# Patient Record
Sex: Male | Born: 1975
Health system: Southern US, Community
[De-identification: ages and names within clinical notes are randomized; demographics above are authoritative.]

## PROBLEM LIST (undated history)

## (undated) DIAGNOSIS — IMO0001 Reserved for inherently not codable concepts without codable children: Secondary | ICD-10-CM

## (undated) DIAGNOSIS — N200 Calculus of kidney: Secondary | ICD-10-CM

## (undated) DIAGNOSIS — K219 Gastro-esophageal reflux disease without esophagitis: Secondary | ICD-10-CM

## (undated) DIAGNOSIS — F32A Depression, unspecified: Secondary | ICD-10-CM

## (undated) DIAGNOSIS — F419 Anxiety disorder, unspecified: Secondary | ICD-10-CM

## (undated) DIAGNOSIS — F329 Major depressive disorder, single episode, unspecified: Secondary | ICD-10-CM

## (undated) DIAGNOSIS — R7989 Other specified abnormal findings of blood chemistry: Secondary | ICD-10-CM

## (undated) DIAGNOSIS — I10 Essential (primary) hypertension: Secondary | ICD-10-CM

## (undated) DIAGNOSIS — G4730 Sleep apnea, unspecified: Secondary | ICD-10-CM

## (undated) HISTORY — DX: Reserved for inherently not codable concepts without codable children: IMO0001

## (undated) HISTORY — DX: Depression, unspecified: F32.A

## (undated) HISTORY — DX: Calculus of kidney: N20.0

## (undated) HISTORY — DX: Sleep apnea, unspecified: G47.30

## (undated) HISTORY — DX: Major depressive disorder, single episode, unspecified: F32.9

## (undated) HISTORY — DX: Other specified abnormal findings of blood chemistry: R79.89

## (undated) HISTORY — DX: Gastro-esophageal reflux disease without esophagitis: K21.9

---

## 2000-03-21 HISTORY — PX: VASECTOMY: SHX75

## 2002-07-30 ENCOUNTER — Emergency Department (HOSPITAL_COMMUNITY): Admission: EM | Admit: 2002-07-30 | Discharge: 2002-07-31 | Payer: Self-pay | Admitting: Emergency Medicine

## 2002-07-31 ENCOUNTER — Encounter: Payer: Self-pay | Admitting: Emergency Medicine

## 2003-01-06 ENCOUNTER — Emergency Department (HOSPITAL_COMMUNITY): Admission: EM | Admit: 2003-01-06 | Discharge: 2003-01-07 | Payer: Self-pay | Admitting: Emergency Medicine

## 2003-09-12 ENCOUNTER — Ambulatory Visit (HOSPITAL_COMMUNITY): Admission: RE | Admit: 2003-09-12 | Discharge: 2003-09-12 | Payer: Self-pay | Admitting: Family Medicine

## 2004-01-20 ENCOUNTER — Ambulatory Visit (HOSPITAL_COMMUNITY): Admission: RE | Admit: 2004-01-20 | Discharge: 2004-01-20 | Payer: Self-pay | Admitting: Family Medicine

## 2005-01-07 ENCOUNTER — Emergency Department (HOSPITAL_COMMUNITY): Admission: EM | Admit: 2005-01-07 | Discharge: 2005-01-07 | Payer: Self-pay | Admitting: Emergency Medicine

## 2006-01-31 ENCOUNTER — Ambulatory Visit: Admission: RE | Admit: 2006-01-31 | Discharge: 2006-01-31 | Payer: Self-pay | Admitting: Family Medicine

## 2006-02-11 ENCOUNTER — Ambulatory Visit: Payer: Self-pay | Admitting: Pulmonary Disease

## 2006-08-18 ENCOUNTER — Ambulatory Visit (HOSPITAL_COMMUNITY): Admission: RE | Admit: 2006-08-18 | Discharge: 2006-08-18 | Payer: Self-pay | Admitting: Family Medicine

## 2007-05-27 ENCOUNTER — Emergency Department (HOSPITAL_COMMUNITY): Admission: EM | Admit: 2007-05-27 | Discharge: 2007-05-27 | Payer: Self-pay | Admitting: Emergency Medicine

## 2008-11-04 ENCOUNTER — Emergency Department (HOSPITAL_COMMUNITY): Admission: EM | Admit: 2008-11-04 | Discharge: 2008-11-04 | Payer: Self-pay | Admitting: Emergency Medicine

## 2009-06-14 ENCOUNTER — Emergency Department (HOSPITAL_COMMUNITY): Admission: EM | Admit: 2009-06-14 | Discharge: 2009-06-14 | Payer: Self-pay | Admitting: Emergency Medicine

## 2010-06-13 LAB — DIFFERENTIAL
Eosinophils Absolute: 0.1 10*3/uL (ref 0.0–0.7)
Eosinophils Relative: 1 % (ref 0–5)
Monocytes Absolute: 0.5 10*3/uL (ref 0.1–1.0)
Monocytes Relative: 7 % (ref 3–12)
Neutro Abs: 3.7 10*3/uL (ref 1.7–7.7)

## 2010-06-13 LAB — URINALYSIS, ROUTINE W REFLEX MICROSCOPIC
Ketones, ur: NEGATIVE mg/dL
Nitrite: NEGATIVE
Protein, ur: NEGATIVE mg/dL
Urobilinogen, UA: 0.2 mg/dL (ref 0.0–1.0)

## 2010-06-13 LAB — CBC
HCT: 41.9 % (ref 39.0–52.0)
MCV: 86.5 fL (ref 78.0–100.0)
Platelets: 227 10*3/uL (ref 150–400)
WBC: 7.4 10*3/uL (ref 4.0–10.5)

## 2010-06-13 LAB — URINE MICROSCOPIC-ADD ON

## 2010-06-13 LAB — BASIC METABOLIC PANEL
BUN: 16 mg/dL (ref 6–23)
CO2: 25 mEq/L (ref 19–32)
Chloride: 105 mEq/L (ref 96–112)
Creatinine, Ser: 1.22 mg/dL (ref 0.4–1.5)
GFR calc Af Amer: >60 mL/min (ref >60)
Glucose, Bld: 115 mg/dL — ABNORMAL HIGH (ref 70–99)
Potassium: 4 mEq/L (ref 3.5–5.1)
Sodium: 138 mEq/L (ref 135–145)

## 2010-06-26 LAB — URINALYSIS, ROUTINE W REFLEX MICROSCOPIC
Glucose, UA: NEGATIVE mg/dL
Leukocytes, UA: NEGATIVE
Nitrite: NEGATIVE
Protein, ur: NEGATIVE mg/dL
Urobilinogen, UA: 0.2 mg/dL (ref 0.0–1.0)

## 2010-06-26 LAB — URINE MICROSCOPIC-ADD ON

## 2010-08-06 NOTE — Procedures (Signed)
NAME:  Jose Adkins, Jose Adkins NO.:  [id number]   MEDICAL RECORD NO.:  [id number]          PATIENT TYPE:  OUT   LOCATION:  SLEEP LAB                     FACILITY:  [facility]   PHYSICIAN:  Barbaraann Share, MD,FCCPDATE OF BIRTH:  October 29, 1974   DATE OF STUDY:  01/31/2006                              NOCTURNAL POLYSOMNOGRAM   REFERRING PHYSICIAN:  Dr. Lorin Picket Lucking   INDICATION FOR THE STUDY:  Hypersomnia with sleep apnea.   EPWORTH SCORE:  17.   SLEEP ARCHITECTURE:  The patient had a total sleep time of 405 minutes with  decreased slow wave sleep as well as REM.  Sleep onset latency was normal at  17 minutes and REM onset was prolonged at 219 minutes.  Sleep efficiency was  mildly decreased at 91%.   RESPIRATORY DATA:  The patient underwent split night protocol where he was  found to have 250 obstructive events in the first 122 minutes of sleep.  This gave the patient a Respiratory Disturbance Index of 123 events per  hour.  Events were not positional but there was extremely loud snoring noted  throughout.  By protocol, the patient was then placed on a small Respironics  Comfort Select nasal mask and CPAP titration was initiated.  At a final  pressure of 12 cm the patient appeared to have reasonable control of his  obstructive events.  However, he only spent 10 minutes of sleep at this  final setting.  There was not enough time due to the nature of split night  studies for more prolonged assessment in pressure titration.   OXYGEN DATA:  His O2 desaturation was low at 71% with the patient's  obstructive events.   CARDIAC DATA:  No clinically significant cardiac arrhythmias.   MOVEMENT/PARASOMNIA:  Rare leg jerks were noted without clinical  significance.   IMPRESSION/RECOMMENDATION:  Very severe obstructive sleep apnea/hypopnea  syndrome with a Respiratory Disturbance Index of 123 events per hour and O2  desaturation as low as 71% during the first half of the night.  The  patient  was then placed on a small Respironics Comfort Select nasal mask and  ultimately titrated to 12 cm with what appears to be fairly good control.  However, the patient spent a very short amount of time on this pressure  level and this may not be adequate pressure.  Clinical correlation is  suggested after getting the patient to an  optimal CPAP of 12 cm and seeing how he responds.  If the patient does not  have significant clinical improvement, I would suggest that he return for  formal CPAP titration given the very severe nature of his sleep apnea.      Barbaraann Share, MD,FCCP  Diplomate, American Board of Sleep  Medicine     [facility]/MEDQ  D:  02/10/2006 16:13:22  T:  02/10/2006 17:46:15  Job:  [id number]

## 2010-12-13 LAB — CBC
HCT: 38.5 — ABNORMAL LOW
MCHC: 34.9
MCV: 86.2
Platelets: 187

## 2010-12-13 LAB — BASIC METABOLIC PANEL
Creatinine, Ser: 1.1
GFR calc Af Amer: >60
GFR calc non Af Amer: >60
Glucose, Bld: 84
Sodium: 139

## 2010-12-13 LAB — DIFFERENTIAL
Basophils Relative: 1
Eosinophils Absolute: 0.1
Lymphocytes Relative: 37
Lymphs Abs: 2.3
Monocytes Absolute: 0.6
Monocytes Relative: 9
Neutro Abs: 3.2
Neutrophils Relative %: 51

## 2010-12-13 LAB — POCT CARDIAC MARKERS
CKMB, poc: <1 — ABNORMAL LOW
Myoglobin, poc: 66.9
Troponin i, poc: <0.05
Troponin i, poc: <0.05

## 2011-06-18 ENCOUNTER — Encounter (HOSPITAL_COMMUNITY): Payer: Self-pay | Admitting: *Deleted

## 2011-06-18 ENCOUNTER — Emergency Department (HOSPITAL_COMMUNITY): Payer: Self-pay

## 2011-06-18 ENCOUNTER — Emergency Department (HOSPITAL_COMMUNITY)
Admission: EM | Admit: 2011-06-18 | Discharge: 2011-06-18 | Disposition: A | Discharge status: Home / Self Care | Payer: Self-pay | Attending: Emergency Medicine | Admitting: Emergency Medicine

## 2011-06-18 DIAGNOSIS — R109 Unspecified abdominal pain: Secondary | ICD-10-CM | POA: Insufficient documentation

## 2011-06-18 HISTORY — DX: Calculus of kidney: N20.0

## 2011-06-18 LAB — CBC
Hemoglobin: 14 g/dL (ref 13.0–17.0)
Platelets: 217 10*3/uL (ref 150–400)
RBC: 4.88 MIL/uL (ref 4.22–5.81)
RDW: 14.9 % (ref 11.5–15.5)

## 2011-06-18 LAB — URINALYSIS, ROUTINE W REFLEX MICROSCOPIC
Nitrite: NEGATIVE
Protein, ur: NEGATIVE mg/dL
Specific Gravity, Urine: 1.025 (ref 1.005–1.030)
Urobilinogen, UA: 0.2 mg/dL (ref 0.0–1.0)

## 2011-06-18 LAB — COMPREHENSIVE METABOLIC PANEL
ALT: 42 U/L (ref 0–53)
Albumin: 4.1 g/dL (ref 3.5–5.2)
CO2: 25 mEq/L (ref 19–32)
Calcium: 8.9 mg/dL (ref 8.4–10.5)
Chloride: 105 mEq/L (ref 96–112)
Creatinine, Ser: 1.13 mg/dL (ref 0.50–1.35)
GFR calc non Af Amer: 82 mL/min — ABNORMAL LOW (ref >90)
Glucose, Bld: 104 mg/dL — ABNORMAL HIGH (ref 70–99)
Potassium: 3.6 mEq/L (ref 3.5–5.1)
Total Protein: 7.1 g/dL (ref 6.0–8.3)

## 2011-06-18 LAB — DIFFERENTIAL
Basophils Absolute: 0.1 10*3/uL (ref 0.0–0.1)
Monocytes Absolute: 0.5 10*3/uL (ref 0.1–1.0)
Monocytes Relative: 7 % (ref 3–12)
Neutro Abs: 3.4 10*3/uL (ref 1.7–7.7)

## 2011-06-18 MED ORDER — MORPHINE SULFATE 4 MG/ML IJ SOLN
8.0000 mg | Freq: Once | INTRAMUSCULAR | Status: AC
  Administered 2011-06-18: 8 mg via INTRAVENOUS
  Filled 2011-06-18: qty 2

## 2011-06-18 MED ORDER — IOHEXOL 300 MG/ML  SOLN
100.0000 mL | Freq: Once | INTRAMUSCULAR | Status: AC | PRN
  Administered 2011-06-18: 100 mL via INTRAVENOUS

## 2011-06-18 MED ORDER — ONDANSETRON HCL 4 MG/2ML IJ SOLN
4.0000 mg | Freq: Once | INTRAMUSCULAR | Status: AC
  Administered 2011-06-18: 4 mg via INTRAVENOUS
  Filled 2011-06-18: qty 2

## 2011-06-18 MED ORDER — OXYCODONE-ACETAMINOPHEN 5-325 MG PO TABS: 1.0000 | ORAL_TABLET | ORAL | Status: AC | PRN

## 2011-06-18 MED ORDER — HYDROMORPHONE HCL PF 1 MG/ML IJ SOLN
1.0000 mg | Freq: Once | INTRAMUSCULAR | Status: AC
  Administered 2011-06-18: 1 mg via INTRAVENOUS
  Filled 2011-06-18: qty 1

## 2011-06-18 NOTE — ED Provider Notes (Signed)
History     CSN: [id number]  Arrival date & time 06/18/11  0207   First MD Initiated Contact with Patient 06/18/11 [phone]      Chief Complaint  Patient presents with  . Abdominal Pain    The history is provided by the patient.   the patient developed severe sharp left-sided abdominal pain that began tonight.  He had an episode of diarrhea and approximately an hour and half later developed spasms of his left-sided abdominal pain.  There is no radiation down to his groin or to his left flank.  He denies dysuria or urinary frequency.  He does report that recently he was started on pain medicine for suspected left-sided ureteral stone his PCP without imaging that was obtained.  The patient does have a history of ureteral stones in the past.  He denies nausea or vomiting.  He reports one episode of diarrhea that was loose without blood.  He denies melena.  Nothing worsens the symptoms.  Nothing improves his symptoms.  Symptoms are intermittent and moderate in severity.  His had no fever or chills.  He denies back pain.  He has no chest pain or shortness of breath.  He has no weakness of his upper lower extremities.  Past Medical History  Diagnosis Date  . Kidney stone     History reviewed. No pertinent past surgical history.  No family history on file.  History  Substance Use Topics  . Smoking status: Never Smoker   . Smokeless tobacco: Not on file  . Alcohol Use: No      Review of Systems  All other systems reviewed and are negative.    Allergies  Review of patient's allergies indicates not on file.  Home Medications   Current Outpatient Rx  Name Route Sig Dispense Refill  . AMBIEN PO Oral Take by mouth.      BP 129/93  Pulse 91  Temp(Src) 98.7 F (37.1 C) (Oral)  Resp 20  Ht 5\' 8"  (1.727 m)  Wt 342 lb (155.13 kg)  BMI 52.00 kg/m2  SpO2 97%  Physical Exam  Nursing note and vitals reviewed. Constitutional: He is oriented to person, place, and time. He appears  well-developed and well-nourished.       Uncomfortable appearing  HENT:  Head: Normocephalic and atraumatic.  Eyes: EOM are normal.  Neck: Normal range of motion.  Cardiovascular: Normal rate, regular rhythm, normal heart sounds and intact distal pulses.   Pulmonary/Chest: Effort normal and breath sounds normal. No respiratory distress.  Abdominal: Soft. He exhibits no distension. There is tenderness.       Sided abdominal tenderness without guarding or rebound  Musculoskeletal: Normal range of motion.  Neurological: He is alert and oriented to person, place, and time.  Skin: Skin is warm and dry.  Psychiatric: He has a normal mood and affect. Judgment normal.    ED Course  Procedures (including critical care time)  Labs Reviewed  COMPREHENSIVE METABOLIC PANEL - Abnormal; Notable for the following:    Glucose, Bld 104 (*)    GFR calc non Af Amer 82 (*)    All other components within normal limits  URINALYSIS, ROUTINE W REFLEX MICROSCOPIC  CBC  DIFFERENTIAL  LIPASE, BLOOD   Ct Abdomen Pelvis W Contrast  06/18/2011  *RADIOLOGY REPORT*  Clinical Data: Left upper and lower quadrant abdominal pain. History of kidney stones.  CT ABDOMEN AND PELVIS WITH CONTRAST  Technique:  Multidetector CT imaging of the abdomen and pelvis was performed  following the standard protocol during bolus administration of intravenous contrast.  Contrast: OMNIPAQUE IOHEXOL 300 MG/ML IJ SOLN  Comparison: 06/14/2009  Findings: Slight fibrosis in the lung bases.  Diffuse low attenuation change throughout the liver consistent with fatty infiltration.  No focal lesions demonstrated.  The spleen, gallbladder, pancreas, adrenal glands, abdominal aorta, and retroperitoneal lymph nodes are unremarkable.  Horseshoe tight kidney.  No solid mass or hydronephrosis demonstrated in the kidneys.  The stomach, small bowel, and colon are not distended. No wall thickening or infiltrative changes.  No evidence of bowel  obstruction.  Minimal fat in the emboli this.  No free air or free fluid in the abdomen.  Scattered mesenteric lymph nodes are not pathologically enlarged.  Pelvis:  No free or loculated pelvic fluid collections.  The prostate gland is not enlarged.  No significant pelvic lymphadenopathy.  The bladder wall is not thickened.  No inflammatory changes in the sigmoid colon or right lower quadrant although the appendix is not specifically identified.  Normal alignment of the lumbar vertebra.  No destructive bone lesions appreciated.  IMPRESSION: Diffuse fatty infiltration of the liver.  Horseshoe kidney.  No evidence of renal obstruction.  No inflammatory changes to account for the patient's pain.  Original Report Authenticated By: Marlon Pel, M.D.   I personally reviewed the CT scan  1. Abdominal pain       MDM  Patient tenderness of his left side of his abdomen without obvious cause.  Given his degree of discomfort a CT scan of his abdomen and pelvis was done as the patient does have a history of ureteral stones.  Labs urine and CT scan are normal.  The patient reports he feels much better at this time.  DC home with close PCP followup and a short course of pain medicine.  He understands to return to the ER for new or worsening symptoms al        Lyanne Co, MD 06/18/11 [phone]

## 2011-06-18 NOTE — ED Notes (Signed)
Pt reports he has been followed by PCP for kidney stone pain for the past couple of weeks, tonight he reports very sharp "stabbing" pain pn left side of abd radiating up rt side

## 2011-09-16 ENCOUNTER — Emergency Department (HOSPITAL_COMMUNITY): Payer: Self-pay

## 2011-09-16 ENCOUNTER — Emergency Department (HOSPITAL_COMMUNITY)
Admission: EM | Admit: 2011-09-16 | Discharge: 2011-09-16 | Disposition: A | Discharge status: Home / Self Care | Payer: Self-pay | Attending: Emergency Medicine | Admitting: Emergency Medicine

## 2011-09-16 ENCOUNTER — Encounter (HOSPITAL_COMMUNITY): Payer: Self-pay | Admitting: Emergency Medicine

## 2011-09-16 ENCOUNTER — Other Ambulatory Visit: Payer: Self-pay

## 2011-09-16 DIAGNOSIS — Z79899 Other long term (current) drug therapy: Secondary | ICD-10-CM | POA: Insufficient documentation

## 2011-09-16 DIAGNOSIS — Z87442 Personal history of urinary calculi: Secondary | ICD-10-CM | POA: Insufficient documentation

## 2011-09-16 DIAGNOSIS — R209 Unspecified disturbances of skin sensation: Secondary | ICD-10-CM | POA: Insufficient documentation

## 2011-09-16 DIAGNOSIS — F411 Generalized anxiety disorder: Secondary | ICD-10-CM | POA: Insufficient documentation

## 2011-09-16 DIAGNOSIS — R51 Headache: Secondary | ICD-10-CM | POA: Insufficient documentation

## 2011-09-16 DIAGNOSIS — R42 Dizziness and giddiness: Secondary | ICD-10-CM | POA: Insufficient documentation

## 2011-09-16 DIAGNOSIS — I951 Orthostatic hypotension: Secondary | ICD-10-CM

## 2011-09-16 HISTORY — DX: Anxiety disorder, unspecified: F41.9

## 2011-09-16 LAB — URINALYSIS, ROUTINE W REFLEX MICROSCOPIC
Glucose, UA: NEGATIVE mg/dL
Ketones, ur: NEGATIVE mg/dL
Leukocytes, UA: NEGATIVE
Specific Gravity, Urine: 1.03 (ref 1.005–1.030)
pH: 6 (ref 5.0–8.0)

## 2011-09-16 LAB — CBC WITH DIFFERENTIAL/PLATELET
Basophils Absolute: 0 10*3/uL (ref 0.0–0.1)
Eosinophils Relative: 2 % (ref 0–5)
HCT: 41.4 % (ref 39.0–52.0)
Hemoglobin: 13.3 g/dL (ref 13.0–17.0)
Lymphocytes Relative: 33 % (ref 12–46)
Lymphs Abs: 2 10*3/uL (ref 0.7–4.0)
MCV: 88.5 fL (ref 78.0–100.0)
Monocytes Absolute: 0.4 10*3/uL (ref 0.1–1.0)
Monocytes Relative: 7 % (ref 3–12)
Neutro Abs: 3.4 10*3/uL (ref 1.7–7.7)
RBC: 4.68 MIL/uL (ref 4.22–5.81)
WBC: 6 10*3/uL (ref 4.0–10.5)

## 2011-09-16 LAB — COMPREHENSIVE METABOLIC PANEL
ALT: 30 U/L (ref 0–53)
Albumin: 3.6 g/dL (ref 3.5–5.2)
Alkaline Phosphatase: 75 U/L (ref 39–117)
CO2: 25 mEq/L (ref 19–32)
Chloride: 104 mEq/L (ref 96–112)
Glucose, Bld: 79 mg/dL (ref 70–99)
Total Protein: 6.6 g/dL (ref 6.0–8.3)

## 2011-09-16 LAB — SALICYLATE LEVEL: Salicylate Lvl: <2 mg/dL — ABNORMAL LOW (ref 2.8–20.0)

## 2011-09-16 LAB — ETHANOL: Alcohol, Ethyl (B): <11 mg/dL (ref 0–11)

## 2011-09-16 LAB — CARDIAC PANEL(CRET KIN+CKTOT+MB+TROPI)
CK, MB: 2.7 ng/mL (ref 0.3–4.0)
Relative Index: 1.7 (ref 0.0–2.5)
Troponin I: <0.3 ng/mL (ref <0.30)

## 2011-09-16 LAB — ACETAMINOPHEN LEVEL: Acetaminophen (Tylenol), Serum: <15 ug/mL (ref 10–30)

## 2011-09-16 LAB — RAPID URINE DRUG SCREEN, HOSP PERFORMED
Barbiturates: NOT DETECTED
Cocaine: NOT DETECTED

## 2011-09-16 MED ORDER — SODIUM CHLORIDE 0.9 % IV BOLUS (SEPSIS)
1000.0000 mL | Freq: Once | INTRAVENOUS | Status: AC
  Administered 2011-09-16: 1000 mL via INTRAVENOUS

## 2011-09-16 MED ORDER — LORAZEPAM 2 MG/ML IJ SOLN
1.0000 mg | Freq: Once | INTRAMUSCULAR | Status: AC
  Administered 2011-09-16: 1 mg via INTRAVENOUS
  Filled 2011-09-16: qty 1

## 2011-09-16 MED ORDER — MECLIZINE HCL 12.5 MG PO TABS
25.0000 mg | ORAL_TABLET | Freq: Once | ORAL | Status: AC
  Administered 2011-09-16: 25 mg via ORAL
  Filled 2011-09-16: qty 2

## 2011-09-16 NOTE — ED Notes (Signed)
Pt sleeping. Wife at bedside .

## 2011-09-16 NOTE — ED Notes (Signed)
NSR on cardiac monitor. Pt anxious and states that he just doesn't feel right. Saline lock in place with no edema or redness. Family at bedside.

## 2011-09-16 NOTE — ED Provider Notes (Signed)
History   This chart was scribed for Glynn Octave, MD by Shari Heritage. The patient was seen in room APA04/APA04. Patient's care was started at 0841.     CSN: [id number]  Arrival date & time 09/16/11  0841   First MD Initiated Contact with Patient 09/16/11 [phone]      Chief Complaint  Patient presents with  . Headache  . Dizziness    (Consider location/radiation/quality/duration/timing/severity/associated sxs/prior treatment) The history is provided by the patient. No language interpreter was used.   Jose Adkins is a 36 y.o. male who presents to the Emergency Department complaining of a throbbing, temporal HA with associated dizziness, tingling in fingers and toes, and lightheadedness. Patient says he woke up with a headache this morning. Patient says that his pain feels like a regular headache. Patient says that he feels like the room is spinning when he sits up or stands up. Patient is alert. Patient knows he is at [facility], his date of birth and his wife's name. Patient says that he has a normal appetite. Patient denies any other pain.Patient was transported to the ED by EMS after he began feeling dizzy at work. Patient with h/o kidney stones and anxiety. Patient takes Cymbalta for anxiety. Patient denies h/o diabetes. Patient has never smoked.  PCP - Lilyan Punt  Past Medical History  Diagnosis Date  . Kidney stone   . Anxiety     History reviewed. No pertinent past surgical history.  History reviewed. No pertinent family history.  History  Substance Use Topics  . Smoking status: Never Smoker   . Smokeless tobacco: Not on file  . Alcohol Use: No      Review of Systems A complete 10 system review of systems was obtained and all systems are negative except as noted in the HPI and PMH.   Allergies  Review of patient's allergies indicates no known allergies.  Home Medications   Current Outpatient Rx  Name Route Sig Dispense Refill  . DULOXETINE HCL 30 MG PO  CPEP Oral Take 30 mg by mouth daily.    Marland Kitchen TEMAZEPAM 7.5 MG PO CAPS Oral Take 7.5 mg by mouth at bedtime as needed. For sleep    . TESTOSTERONE 50 MG/5GM TD GEL Transdermal Place 5 g onto the skin daily. 4 pumps      BP 123/92  Pulse 82  Temp 99.2 F (37.3 C) (Oral)  Resp 20  Ht 5\' 8"  (1.727 m)  Wt 340 lb (154.223 kg)  BMI 51.70 kg/m2  SpO2 98%  Physical Exam  Nursing note and vitals reviewed. Constitutional: He is oriented to person, place, and time. He appears well-developed and well-nourished.  HENT:  Head: Normocephalic and atraumatic.  Eyes: Conjunctivae and EOM are normal. Pupils are equal, round, and reactive to light. Right eye exhibits no nystagmus. Left eye exhibits no nystagmus.  Neck: Normal range of motion. Neck supple.  Cardiovascular: Normal rate, regular rhythm and normal heart sounds.   Pulmonary/Chest: Effort normal and breath sounds normal. No respiratory distress. He has no wheezes.  Abdominal: Soft. Bowel sounds are normal. There is no tenderness.  Musculoskeletal: Normal range of motion.       Normal extremity strength bilaterally.  Neurological: He is alert and oriented to person, place, and time. No cranial nerve deficit.       Cranial nerves 3-12 are intact. 5/5 strength throughout. No ataxia finger to nose. No asterixis. No meningismus.    Skin: Skin is warm and dry.  Psychiatric: He has a normal mood and affect.    ED Course  Procedures (including critical care time) DIAGNOSTIC STUDIES: Oxygen Saturation is 97% on room air, adequate by my interpretation.    COORDINATION OF CARE: 8:53AM- Patient informed of current plan for treatment and evaluation and agrees with plan at this time.   10:23AM- Checked on patient and updated patient on blood work results. Patients reports that he is still experiencing tingling in fingers and toes.  11:40AM- Patient reports that he is ready to be discharged. Patient is ambulatory. Blood work and CT show now acute  conditions.   Results for orders placed during the hospital encounter of 09/16/11  CBC WITH DIFFERENTIAL      Component Value Range   WBC 6.0  4.0 - 10.5 K/uL   RBC 4.68  4.22 - 5.81 MIL/uL   Hemoglobin 13.3  13.0 - 17.0 g/dL   HCT 69.6  29.5 - 28.4 %   MCV 88.5  78.0 - 100.0 fL   MCH 28.4  26.0 - 34.0 pg   MCHC 32.1  30.0 - 36.0 g/dL   RDW 13.2  44.0 - 10.2 %   Platelets 195  150 - 400 K/uL   Neutrophils Relative 57  43 - 77 %   Neutro Abs 3.4  1.7 - 7.7 K/uL   Lymphocytes Relative 33  12 - 46 %   Lymphs Abs 2.0  0.7 - 4.0 K/uL   Monocytes Relative 7  3 - 12 %   Monocytes Absolute 0.4  0.1 - 1.0 K/uL   Eosinophils Relative 2  0 - 5 %   Eosinophils Absolute 0.1  0.0 - 0.7 K/uL   Basophils Relative 1  0 - 1 %   Basophils Absolute 0.0  0.0 - 0.1 K/uL  COMPREHENSIVE METABOLIC PANEL      Component Value Range   Sodium 138  135 - 145 mEq/L   Potassium 4.0  3.5 - 5.1 mEq/L   Chloride 104  96 - 112 mEq/L   CO2 25  19 - 32 mEq/L   Glucose, Bld 79  70 - 99 mg/dL   BUN 14  6 - 23 mg/dL   Creatinine, Ser 7.25  0.50 - 1.35 mg/dL   Calcium 9.1  8.4 - 36.6 mg/dL   Total Protein 6.6  6.0 - 8.3 g/dL   Albumin 3.6  3.5 - 5.2 g/dL   AST 26  0 - 37 U/L   ALT 30  0 - 53 U/L   Alkaline Phosphatase 75  39 - 117 U/L   Total Bilirubin 0.2 (*) 0.3 - 1.2 mg/dL   GFR calc non Af Amer 78 (*) >90 mL/min   GFR calc Af Amer >90  >90 mL/min  URINALYSIS, ROUTINE W REFLEX MICROSCOPIC      Component Value Range   Color, Urine YELLOW  YELLOW   APPearance CLEAR  CLEAR   Specific Gravity, Urine 1.030  1.005 - 1.030   pH 6.0  5.0 - 8.0   Glucose, UA NEGATIVE  NEGATIVE mg/dL   Hgb urine dipstick NEGATIVE  NEGATIVE   Bilirubin Urine NEGATIVE  NEGATIVE   Ketones, ur NEGATIVE  NEGATIVE mg/dL   Protein, ur NEGATIVE  NEGATIVE mg/dL   Urobilinogen, UA 0.2  0.0 - 1.0 mg/dL   Nitrite NEGATIVE  NEGATIVE   Leukocytes, UA NEGATIVE  NEGATIVE  CARDIAC PANEL(CRET KIN+CKTOT+MB+TROPI)      Component Value Range    Total CK 163  7 -  232 U/L   CK, MB 2.7  0.3 - 4.0 ng/mL   Troponin I <0.30  <0.30 ng/mL   Relative Index 1.7  0.0 - 2.5  LIPASE, BLOOD      Component Value Range   Lipase 44  11 - 59 U/L  URINE RAPID DRUG SCREEN (HOSP PERFORMED)      Component Value Range   Opiates NONE DETECTED  NONE DETECTED   Cocaine NONE DETECTED  NONE DETECTED   Benzodiazepines POSITIVE (*) NONE DETECTED   Amphetamines NONE DETECTED  NONE DETECTED   Tetrahydrocannabinol NONE DETECTED  NONE DETECTED   Barbiturates NONE DETECTED  NONE DETECTED  ETHANOL      Component Value Range   Alcohol, Ethyl (B) <11  0 - 11 mg/dL  ACETAMINOPHEN LEVEL      Component Value Range   Acetaminophen (Tylenol), Serum <15.0  10 - 30 ug/mL  SALICYLATE LEVEL      Component Value Range   Salicylate Lvl <2.0 (*) 2.8 - 20.0 mg/dL    Ct Head Wo Contrast  09/16/2011  *RADIOLOGY REPORT*  Clinical Data:  Dizziness and altered consciousness.  CT HEAD WITHOUT CONTRAST  Technique:  Contiguous axial images were obtained from the base of the skull through the vertex without contrast  Comparison:  CT 01/07/2005  Findings:  The brain has a normal appearance without evidence for hemorrhage, acute infarction, hydrocephalus, or mass lesion.  There is no extra axial fluid collection.  The skull and paranasal sinuses are normal.  IMPRESSION: Normal CT of the head without contrast.  Original Report Authenticated By: Camelia Phenes, M.D.     No diagnosis found.    MDM  Awoke with headache, lightheadedness, dizziness with position change.  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 Oriented x 3 here and at neuro baseline per wife.  No chest pain, abdominal pain, back pain.  C/o tingling in all fingers and toes.   CT negative.  Labs unremarkable.  Orthostatic positive.  No focal neuro deficits. IVF hydration.  Tolerating PO in the ED. No further headache or dizziness.  No neurological deficits.  No confusion. At  baseline per wife.  Ambulatory without assistance.    Date: 09/16/2011  Rate: 85  Rhythm: normal sinus rhythm  QRS Axis: normal  Intervals: normal  ST/T Wave abnormalities: normal  Conduction Disutrbances:none  Narrative Interpretation:   Old EKG Reviewed: unchanged    I personally performed the services described in this documentation, which was scribed in my presence.  The recorded information has been reviewed and considered.     Glynn Octave, MD 09/16/11 [phone]

## 2011-09-16 NOTE — ED Notes (Signed)
Pt having epigastric discomfort for past few hours. Pt having tingling in finger tips. Denies true pain states tightness/fullness

## 2011-09-16 NOTE — ED Notes (Signed)
Pt tolerated po Sprite with no complaints of nausea. States that his dizziness is better.

## 2011-09-16 NOTE — ED Notes (Signed)
Pt given po Sprite to drink.

## 2011-09-16 NOTE — Discharge Instructions (Signed)
Orthostatic Hypotension Orthostatic hypotension is a sudden fall in blood pressure. It occurs when a person goes from a sitting or lying position to a standing position. CAUSES   Loss of body fluids (dehydration).   Medicines that lower blood pressure.   Sudden changes in posture, such as sudden standing when you have been sitting or lying down.   Taking too much of your medicine.  SYMPTOMS   Lightheadedness or dizziness.   Fainting or near-fainting.   A fast heart rate (tachycardia).   Weakness.   Feeling tired (fatigue).  DIAGNOSIS  Your caregiver may find the cause of orthostatic hypotension through:  A history and/or physical exam.   Checking your blood pressure. Your caregiver will check your blood pressure when you are:   Lying down.   Sitting.   Standing.   Tilt table testing. In this test, you are placed on a table that goes from a lying position to a standing position. You will be strapped to the table. This test helps to monitor your blood pressure and heart rate when you are in different positions.  TREATMENT   If orthostatic hypotension is caused by your medicines, your caregiver will need to adjust your dosage. Do not stop or adjust your medicine on your own.   When changing positions, make these changes slowly. This allows your body to adjust to the different position.   Compression stockings that are worn on your lower legs may be helpful.   Your caregiver may have you consume extra salt. Do not add extra salt to your diet unless directed by your caregiver.   Eat frequent, small meals. Avoid sudden standing after eating.   Avoid hot showers or excessive heat.   Your caregiver may give you fluids through the vein (intravenous).   Your caregiver may put you on medicine to help enhance fluid retention.  SEEK IMMEDIATE MEDICAL CARE IF:   You faint or have a near-fainting episode. Call your local emergency services (911 in [place]   You have or  develop chest pain.   You feel sick to your stomach (nauseous) or vomit.   You have a loss of feeling or movement in your arms or legs.   You have difficulty talking, slurred speech, or you are unable to talk.   You have difficulty thinking or have confused thinking.  MAKE SURE YOU:   Understand these instructions.   Will watch your condition.   Will get help right away if you are not doing well or get worse.  Document Released: 02/25/2002 Document Revised: 02/24/2011 Document Reviewed: 06/20/2008 ExitCare Patient Information 2012 [employer]. 

## 2011-12-13 ENCOUNTER — Ambulatory Visit (HOSPITAL_COMMUNITY)
Admission: RE | Admit: 2011-12-13 | Discharge: 2011-12-13 | Disposition: A | Discharge status: Home / Self Care | Payer: PRIVATE HEALTH INSURANCE | Source: Ambulatory Visit | Attending: Family Medicine | Admitting: Family Medicine

## 2011-12-13 ENCOUNTER — Other Ambulatory Visit: Payer: Self-pay | Admitting: Family Medicine

## 2011-12-13 DIAGNOSIS — M25579 Pain in unspecified ankle and joints of unspecified foot: Secondary | ICD-10-CM

## 2011-12-13 DIAGNOSIS — M79673 Pain in unspecified foot: Secondary | ICD-10-CM

## 2012-02-08 ENCOUNTER — Emergency Department (HOSPITAL_COMMUNITY): Payer: Worker's Compensation

## 2012-02-08 ENCOUNTER — Emergency Department (HOSPITAL_COMMUNITY)
Admission: EM | Admit: 2012-02-08 | Discharge: 2012-02-08 | Disposition: A | Discharge status: Home / Self Care | Payer: Worker's Compensation | Attending: Emergency Medicine | Admitting: Emergency Medicine

## 2012-02-08 ENCOUNTER — Encounter (HOSPITAL_COMMUNITY): Payer: Self-pay | Admitting: *Deleted

## 2012-02-08 DIAGNOSIS — Y939 Activity, unspecified: Secondary | ICD-10-CM | POA: Insufficient documentation

## 2012-02-08 DIAGNOSIS — Z87442 Personal history of urinary calculi: Secondary | ICD-10-CM | POA: Insufficient documentation

## 2012-02-08 DIAGNOSIS — Z8659 Personal history of other mental and behavioral disorders: Secondary | ICD-10-CM | POA: Insufficient documentation

## 2012-02-08 DIAGNOSIS — S5001XA Contusion of right elbow, initial encounter: Secondary | ICD-10-CM

## 2012-02-08 DIAGNOSIS — Y929 Unspecified place or not applicable: Secondary | ICD-10-CM | POA: Insufficient documentation

## 2012-02-08 DIAGNOSIS — X58XXXA Exposure to other specified factors, initial encounter: Secondary | ICD-10-CM | POA: Insufficient documentation

## 2012-02-08 DIAGNOSIS — Z79899 Other long term (current) drug therapy: Secondary | ICD-10-CM | POA: Insufficient documentation

## 2012-02-08 DIAGNOSIS — S5000XA Contusion of unspecified elbow, initial encounter: Secondary | ICD-10-CM | POA: Insufficient documentation

## 2012-02-08 MED ORDER — ACETAMINOPHEN 500 MG PO TABS
1000.0000 mg | ORAL_TABLET | Freq: Once | ORAL | Status: AC
  Administered 2012-02-08: 1000 mg via ORAL
  Filled 2012-02-08: qty 2

## 2012-02-08 MED ORDER — IBUPROFEN 800 MG PO TABS
800.0000 mg | ORAL_TABLET | Freq: Once | ORAL | Status: AC
  Administered 2012-02-08: 800 mg via ORAL
  Filled 2012-02-08: qty 1

## 2012-02-08 NOTE — ED Provider Notes (Signed)
History   This chart was scribed for Donnetta Hutching, MD by Thad Ranger, ED Scribe. This patient was seen in room APA12/APA12 and the patient's care was started at 7:35 AM.   CSN: [id number]  Arrival date & time 02/08/12  0739   None     Chief Complaint  Patient presents with  . Arm Injury    The history is provided by the patient and a friend. No language interpreter was used.    Jose Adkins is a 36 y.o. male who presents to the Emergency Department complaining of localized, moderate to severe, consatnt right elbow pain due to the patient getting in contact with a machine at work.There are no associated symptoms. Pain is aggravated with movement of right elbow. Patient did not try to relief the pain with pain medications.  Patient otherwise healthy.    Past Medical History  Diagnosis Date  . Kidney stone   . Anxiety     No past surgical history on file.  No family history on file.  History  Substance Use Topics  . Smoking status: Never Smoker   . Smokeless tobacco: Not on file  . Alcohol Use: No      Review of Systems  A complete 10 system review of systems was obtained and all systems are negative except as noted in the HPI and PMH.    Allergies  Review of patient's allergies indicates no known allergies.  Home Medications   Current Outpatient Rx  Name  Route  Sig  Dispense  Refill  . DULOXETINE HCL 30 MG PO CPEP   Oral   Take 30 mg by mouth daily.         Marland Kitchen TEMAZEPAM 7.5 MG PO CAPS   Oral   Take 7.5 mg by mouth at bedtime as needed. For sleep         . TESTOSTERONE 50 MG/5GM TD GEL   Transdermal   Place 5 g onto the skin daily. 4 pumps           BP 148/112  Pulse 101  Temp 99.1 F (37.3 C) (Oral)  Resp 16  Ht 5\' 8"  (1.727 m)  Wt 325 lb (147.419 kg)  BMI 49.42 kg/m2  SpO2 97%  Physical Exam  Constitutional: He is oriented to person, place, and time. He appears well-developed.  HENT:  Head: Normocephalic.  Neck: Normal  range of motion.  Musculoskeletal: Normal range of motion. He exhibits no edema.       Erythema on the anterior later and posterior aspect of right elbow. Erythema is proximately 12x7 cm in range.  Pt is able to flex at the elbow about 90 degrees.    Neurological: He is alert and oriented to person, place, and time.  Skin: Skin is warm and dry.  Psychiatric: He has a normal mood and affect. His behavior is normal.    ED Course  Procedures (including critical care time)  DIAGNOSTIC STUDIES: Oxygen Saturation is 97% on room air, adequate by my interpretation.    COORDINATION OF CARE: 7:57 AM Discussed treatment plan which includes X-rays and discussed a referral to an orthopedic doctor of right arm with pt at bedside and pt agreed to plan.    Labs Reviewed - No data to display No results found.   No diagnosis found.  Dg Elbow Complete Right  02/08/2012  *RADIOLOGY REPORT*  Clinical Data: Pain and swelling following recent injury  RIGHT ELBOW - COMPLETE 3+ VIEW  Comparison:  None.  Findings: No acute fracture or dislocation is noted.  A small exostosis is noted arising from the distal humerus laterally.  No soft tissue abnormality is seen.  IMPRESSION: No acute abnormality is noted.   Original Report Authenticated By: Alcide Clever, M.D.     MDM  Right elbow x-ray shows no fracture. Neurovascular intact. Discharge home with sling, alternate, or ibuprofen for pain. "Worker's compensation treatment sheet"  filled out      I personally performed the services described in this documentation, which was scribed in my presence. The recorded information has been reviewed and is accurate.    Donnetta Hutching, MD 02/08/12 [phone]

## 2012-02-08 NOTE — ED Notes (Signed)
Pt got his right arm caught in a machine at work. Workman's comp and will require drug screening after treatment. [employer]. Redness to right arm.

## 2012-05-21 ENCOUNTER — Ambulatory Visit (HOSPITAL_COMMUNITY)
Admission: RE | Admit: 2012-05-21 | Discharge: 2012-05-21 | Disposition: A | Discharge status: Home / Self Care | Payer: PRIVATE HEALTH INSURANCE | Source: Ambulatory Visit | Attending: Family Medicine | Admitting: Family Medicine

## 2012-05-21 ENCOUNTER — Other Ambulatory Visit (HOSPITAL_COMMUNITY): Payer: Self-pay | Admitting: Family Medicine

## 2012-05-21 DIAGNOSIS — R05 Cough: Secondary | ICD-10-CM | POA: Insufficient documentation

## 2012-05-21 DIAGNOSIS — R059 Cough, unspecified: Secondary | ICD-10-CM | POA: Insufficient documentation

## 2012-05-21 DIAGNOSIS — R6883 Chills (without fever): Secondary | ICD-10-CM | POA: Insufficient documentation

## 2012-06-20 ENCOUNTER — Other Ambulatory Visit (HOSPITAL_COMMUNITY): Payer: Self-pay

## 2012-06-20 DIAGNOSIS — G4700 Insomnia, unspecified: Secondary | ICD-10-CM

## 2012-06-20 DIAGNOSIS — G4730 Sleep apnea, unspecified: Secondary | ICD-10-CM

## 2012-06-25 ENCOUNTER — Ambulatory Visit: Payer: PRIVATE HEALTH INSURANCE | Attending: Family Medicine | Admitting: Sleep Medicine

## 2012-06-25 DIAGNOSIS — G4710 Hypersomnia, unspecified: Secondary | ICD-10-CM

## 2012-06-25 DIAGNOSIS — G4733 Obstructive sleep apnea (adult) (pediatric): Secondary | ICD-10-CM | POA: Insufficient documentation

## 2012-06-25 DIAGNOSIS — Z6841 Body Mass Index (BMI) 40.0 and over, adult: Secondary | ICD-10-CM | POA: Insufficient documentation

## 2012-06-26 NOTE — Patient Instructions (Addendum)
CPAP and BIPAP  CPAP and BIPAP are methods of helping you breathe. CPAP stands for "continuous positive airway pressure." BIPAP stands for "bi-level positive airway pressure." Both CPAP and BIPAP are provided by a small machine with a flexible plastic tube that attaches to a plastic mask that goes over your nose or mouth. Air is blown into your air passages through your nose or mouth. This helps to keep your airways open and helps to keep you breathing well.  The amount of pressure that is used to blow the air into your air passages can be set on the machine. The pressure setting is based on your needs. With CPAP, the amount of pressure stays the same while you breathe in and out. With BIPAP, the amount of pressure changes when you inhale and exhale. Your caregiver will recommend whether CPAP or BIPAP would be more helpful for you.    CPAP and BIPAP can be helpful for both adults and children with:   Sleep apnea.   Chronic Obstructive Pulmonary Disease (COPD), a condition like emphysema.   Diseases which weaken the muscles of the chest such as muscular dystrophy or neurological diseases.   Other problems that cause breathing to be weak or difficult.  USE OF CPAP OR BIPAP  The respiratory therapist or technician will help you get used to wearing the mask. Some people feel claustrophobic (a trapped or closed in feeling) at first, because the mask needs to be fairly snug on your face.     It may help you to get used to the mask gradually, by first holding the mask loosely over your nose or mouth using a low pressure setting on the machine. Gradually the mask can be applied more snugly with increased pressure. You can also gradually increase the amount of time the mask is used.   People with sleep apnea will use the mask and machine at night when they are sleeping. Others, like those with ALS or other breathing difficulties, may need the CPAP or BIPAP all the time.    If the first mask you try does not fit well, or is uncomfortable, there are other types and sizes that can be tried.   If you tend to breathe through your mouth, a chin strap may be applied to help keep your mouth closed (if you are using a nasal mask).   The CPAP and BIPAP machines have alarms that may sound if the mask comes off or develops a leak.   You should not eat or drink while the CPAP or BIPAP is on. Food or fluids could get pushed into your lungs by the pressure of the CPAP or BIPAP.  Sometimes CPAP or BIPAP machines are ordered for home use. If you are going to use the CPAP or BIPAP machine at home, follow these instructions   CPAP or BIPAP machines can be rented or purchased through home health care companies. There are many different brands of machines available. If you rent a machine before purchasing you may find which particular machine works well for you.   Ask questions if there is something you do not understand when picking out your machine.   Place your CPAP or BIPAP machine on a secure table or stand near an electrical outlet.   Know where the On/Off switch is.   Follow your doctor's instructions for how to set the pressure on your machine and when you should use it.   Do not smoke! Tobacco smoke residue can damage the   machine.  SEEK IMMEDIATE MEDICAL CARE IF:     You have redness or open areas around your nose or mouth.   You have trouble operating the CPAP or BIPAP machine.   You cannot tolerate wearing the CPAP or BIPAP mask.   You have any questions or concerns.  Document Released: 12/04/2003 Document Revised: 05/30/2011 Document Reviewed: 03/04/2008  [employer] Patient Information 2013 [employer].

## 2012-06-30 NOTE — Procedures (Signed)
[facility] Kameka Whan A. Gerilyn Pilgrim, MD     [email or web address]        NAME:  Jose Adkins, Jose Adkins               ACCOUNT NO.:  [id number]  MEDICAL RECORD NO.:  [id number]          PATIENT TYPE:  OUT  LOCATION:  SLEEP LAB                     FACILITY:  [facility]  PHYSICIAN:  Sianni Cloninger A. Gerilyn Pilgrim, M.D. DATE OF BIRTH:  1976/02/16                            NOCTURNAL POLYSOMNOGRAM  REFERRING PHYSICIAN:  Scott A. Luking, MD  INDICATION:  A 37 year old, who had a previous diagnostic sleep study showing obstructive sleep apnea syndrome.  This is a CPAP titration recording.   MEDICATIONS:  Ambien, Prozac.  EPWORTH SLEEPINESS SCALE:  14.  BMI 51.  ARCHITECTURAL SUMMARY:  The total recording time is 408 minutes.  Sleep efficiency 85%.  Sleep latency 17 minutes.  REM latency 144 minutes. Stage N1 9%, N2 44%, N3 19%, and REM sleep 27%.  RESPIRATORY SUMMARY:  Baseline oxygen saturation is 96, lowest saturation 84 during non-REM sleep.  The patient was started on positive pressure at 5 and titrated to 15.  The optimal pressure is 15 with resolution of events and good tolerance.  LIMB MOVEMENT SUMMARY:  PLM index is 49.  ELECTROCARDIOGRAM SUMMARY:  Average heart rate is 64 with no significant dysrhythmias observed.  IMPRESSION: 1. Obstructive sleep apnea syndrome, responds well to CPAP of 15. 2. Severe periodic limb movement disorder sleep.  RECOMMENDATIONS:  CPAP of 15.  Also consider treatment of his periodic limb movement disorder sleep if he remains symptomatic after being on positive pressure.  This can be treated with Mirapex or Requip.  Thanks for this referral.    Vonnie Ligman A. Gerilyn Pilgrim, M.D.    KAD/MEDQ  D:  06/30/2012 12:11:08  T:  06/30/2012 12:24:12  Job:  [id number]

## 2012-07-20 ENCOUNTER — Encounter: Payer: Self-pay | Admitting: *Deleted

## 2012-07-20 ENCOUNTER — Telehealth: Payer: Self-pay | Admitting: *Deleted

## 2012-07-20 NOTE — Telephone Encounter (Signed)
Pt called to inform that cpap rx with documentation was not accepted at [facility] d/t type of insurance.  Documentation faxed to [facility].

## 2012-09-14 ENCOUNTER — Other Ambulatory Visit: Payer: Self-pay | Admitting: *Deleted

## 2012-09-14 MED ORDER — FLUOXETINE HCL 20 MG PO TABS: ORAL_TABLET | ORAL | Status: DC

## 2012-10-15 ENCOUNTER — Other Ambulatory Visit: Payer: Self-pay | Admitting: Family Medicine

## 2012-10-15 NOTE — Telephone Encounter (Signed)
RX called in on voicemail 

## 2012-10-15 NOTE — Telephone Encounter (Signed)
May refill x4 total

## 2012-10-15 NOTE — Telephone Encounter (Signed)
Last office visit 03/2012.

## 2012-10-18 ENCOUNTER — Encounter: Payer: Self-pay | Admitting: Family Medicine

## 2012-10-18 ENCOUNTER — Ambulatory Visit (INDEPENDENT_AMBULATORY_CARE_PROVIDER_SITE_OTHER): Payer: PRIVATE HEALTH INSURANCE | Admitting: Family Medicine

## 2012-10-18 VITALS — BP 138/90 | HR 80 | Wt 342.2 lb

## 2012-10-18 DIAGNOSIS — G4700 Insomnia, unspecified: Secondary | ICD-10-CM | POA: Insufficient documentation

## 2012-10-18 DIAGNOSIS — G4733 Obstructive sleep apnea (adult) (pediatric): Secondary | ICD-10-CM | POA: Insufficient documentation

## 2012-10-18 DIAGNOSIS — N200 Calculus of kidney: Secondary | ICD-10-CM

## 2012-10-18 DIAGNOSIS — R300 Dysuria: Secondary | ICD-10-CM

## 2012-10-18 DIAGNOSIS — F411 Generalized anxiety disorder: Secondary | ICD-10-CM

## 2012-10-18 LAB — POCT URINALYSIS DIPSTICK: Spec Grav, UA: 1.015

## 2012-10-18 MED ORDER — ONDANSETRON HCL 8 MG PO TABS: 8.0000 mg | ORAL_TABLET | Freq: Three times a day (TID) | ORAL | Status: DC | PRN

## 2012-10-18 MED ORDER — CIPROFLOXACIN HCL 500 MG PO TABS: 500.0000 mg | ORAL_TABLET | Freq: Two times a day (BID) | ORAL | Status: AC

## 2012-10-18 MED ORDER — ZOLPIDEM TARTRATE 5 MG PO TABS: 5.0000 mg | ORAL_TABLET | Freq: Every evening | ORAL | Status: DC | PRN

## 2012-10-18 MED ORDER — OXYCODONE-ACETAMINOPHEN 7.5-325 MG PO TABS: 1.0000 | ORAL_TABLET | ORAL | Status: DC | PRN

## 2012-10-18 MED ORDER — FLUOXETINE HCL 20 MG PO TABS: ORAL_TABLET | ORAL | Status: DC

## 2012-10-18 MED ORDER — TAMSULOSIN HCL 0.4 MG PO CAPS: 0.4000 mg | ORAL_CAPSULE | Freq: Every day | ORAL | Status: DC

## 2012-10-18 MED ORDER — MECLIZINE HCL 25 MG PO TABS: 25.0000 mg | ORAL_TABLET | Freq: Three times a day (TID) | ORAL | Status: AC | PRN

## 2012-10-18 NOTE — Progress Notes (Signed)
  Subjective:    Patient ID: Jose Adkins, male    DOB: 1976-03-08, 37 y.o.   MRN: [id number]  HPI  Patient is here for an checkup on medication and is concerned about burning when he urinates and he wants to discuss his sleep apnea Patient overall doing well CPAP machine helps energy level doing well. Takes medicine as directed. Denies abusing the medication. Patient also relates he is having some flank pain and slight dysuria present over the past few days has history of kidney stones. On recent visit patient did have hematuria on today's exam a urine was collected  Past medical history sleep apnea, insomnia, kidney stones Review of Systems See above    Objective:   Physical Exam Significant obesity Lungs clear no crackles Heart regular no murmurs Mild flank tenderness. Urinalysis with RBCs       Assessment & Plan:  #1 probable kidney stone-Flomax 1 daily, oxycodone 10 mg every 4 hours when necessary severe pain, if not improving over the next several days notify us.  #2 sleep apnea continue CPAP machine  Depression stable refills given see back 6 months 4 insomnia Ambien 5 mg each bedtime Patient states that he is not currently using AndroGel. I I told him that that is probably a wise

## 2012-10-20 LAB — URINE CULTURE: Colony Count: 75000

## 2013-02-07 ENCOUNTER — Other Ambulatory Visit: Payer: Self-pay | Admitting: Family Medicine

## 2013-02-07 NOTE — Telephone Encounter (Signed)
Ok plus three ref 

## 2013-02-19 ENCOUNTER — Ambulatory Visit (INDEPENDENT_AMBULATORY_CARE_PROVIDER_SITE_OTHER): Payer: PRIVATE HEALTH INSURANCE | Admitting: Family Medicine

## 2013-02-19 ENCOUNTER — Encounter: Payer: Self-pay | Admitting: Family Medicine

## 2013-02-19 VITALS — BP 136/84 | Ht 67.0 in | Wt 365.0 lb

## 2013-02-19 DIAGNOSIS — F411 Generalized anxiety disorder: Secondary | ICD-10-CM

## 2013-02-19 DIAGNOSIS — G4733 Obstructive sleep apnea (adult) (pediatric): Secondary | ICD-10-CM

## 2013-02-19 DIAGNOSIS — Z0000 Encounter for general adult medical examination without abnormal findings: Secondary | ICD-10-CM

## 2013-02-19 DIAGNOSIS — Z23 Encounter for immunization: Secondary | ICD-10-CM

## 2013-02-19 DIAGNOSIS — G4700 Insomnia, unspecified: Secondary | ICD-10-CM

## 2013-02-19 MED ORDER — ZOLPIDEM TARTRATE 10 MG PO TABS: ORAL_TABLET | ORAL | Status: DC

## 2013-02-19 NOTE — Progress Notes (Signed)
   Subjective:    Patient ID: Jose Adkins, male    DOB: 10-07-1975, 37 y.o.   MRN: [id number]  HPI  Patient arrives for an annual physical. Patient would like to increase Ambien back to 10 mg. he states 5 mg Ambien is no longer help him sleep. He denies abusing it. He states that he does do but at least 8 hours for sleep. In addition to this he also relates that he is doing his best at trying to eat healthy watch his weight exercise some but his weight keeps going up he denies any chest tightness pressure pain shortness breath nausea vomiting diarrhea. Has family history of obesity past medical history benign   Review of Systems  Constitutional: Negative for fever, activity change and appetite change.  HENT: Negative for congestion and rhinorrhea.   Eyes: Negative for discharge.  Respiratory: Negative for cough and wheezing.   Cardiovascular: Negative for chest pain.  Gastrointestinal: Negative for vomiting, abdominal pain and blood in stool.  Genitourinary: Negative for frequency and difficulty urinating.  Musculoskeletal: Negative for neck pain.  Skin: Negative for rash.  Allergic/Immunologic: Negative for environmental allergies and food allergies.  Neurological: Negative for weakness and headaches.  Psychiatric/Behavioral: Negative for agitation.       Objective:   Physical Exam  Constitutional: He appears well-developed and well-nourished.  HENT:  Head: Normocephalic and atraumatic.  Right Ear: External ear normal.  Left Ear: External ear normal.  Nose: Nose normal.  Mouth/Throat: Oropharynx is clear and moist.  Eyes: EOM are normal. Pupils are equal, round, and reactive to light.  Neck: Normal range of motion. Neck supple. No thyromegaly present.  Cardiovascular: Normal rate, regular rhythm and normal heart sounds.   No murmur heard. Pulmonary/Chest: Effort normal and breath sounds normal. No respiratory distress. He has no wheezes.  Abdominal: Soft. Bowel sounds are  normal. He exhibits no distension and no mass. There is no tenderness.  Genitourinary: Penis normal.  Musculoskeletal: Normal range of motion. He exhibits no edema.  Lymphadenopathy:    He has no cervical adenopathy.  Neurological: He is alert. He exhibits normal muscle tone.  Skin: Skin is warm and dry. No erythema.  Psychiatric: He has a normal mood and affect. His behavior is normal. Judgment normal.          Assessment & Plan:  #1 wellness-patient having significant weight issues he was counseled regarding the importance of losing weight. Also in addition to all of this patient would benefit from a gastric bypass. We'll look into seeing if this is covered #2 lipids look good #3 blood pressure were respectable exercise watching diet discussed #4 insomnia works third shift Ambien 10 mg as needed to sleep dedicate at least 8 hours.

## 2013-03-07 ENCOUNTER — Ambulatory Visit: Payer: PRIVATE HEALTH INSURANCE | Admitting: Family Medicine

## 2013-04-03 ENCOUNTER — Ambulatory Visit (INDEPENDENT_AMBULATORY_CARE_PROVIDER_SITE_OTHER): Payer: PRIVATE HEALTH INSURANCE | Admitting: Family Medicine

## 2013-04-03 ENCOUNTER — Encounter: Payer: Self-pay | Admitting: Family Medicine

## 2013-04-03 VITALS — BP 122/74 | Temp 98.6°F | Ht 67.0 in | Wt 314.0 lb

## 2013-04-03 DIAGNOSIS — R109 Unspecified abdominal pain: Secondary | ICD-10-CM

## 2013-04-03 LAB — POCT URINALYSIS DIPSTICK
RBC UA: 250
Spec Grav, UA: >=1.03
pH, UA: 5

## 2013-04-03 MED ORDER — OXYCODONE-ACETAMINOPHEN 10-325 MG PO TABS: 1.0000 | ORAL_TABLET | ORAL | Status: DC | PRN

## 2013-04-03 MED ORDER — ONDANSETRON HCL 8 MG PO TABS: 8.0000 mg | ORAL_TABLET | Freq: Three times a day (TID) | ORAL | Status: DC | PRN

## 2013-04-03 MED ORDER — CIPROFLOXACIN HCL 500 MG PO TABS: 500.0000 mg | ORAL_TABLET | Freq: Two times a day (BID) | ORAL | Status: DC

## 2013-04-03 NOTE — Progress Notes (Signed)
   Subjective:    Patient ID: Jose Adkins, male    DOB: 1975-06-24, 38 y.o.   MRN: [id number]  HPINumbness in legs, body aches, lightheaded, balance is off, sharp pain on left side of abdomen, history of kidney stones, dark urine. Symptoms started 3 days ago. Took antivert for vertigo and pain med for low back pain.  PMH otherwise benign  He does have a history of kidney stones Review of Systems Denies coughing runny nose sore throat he does relate some myalgias in the arms he also relates some fatigue and slight dizziness also left flank pain    Objective:   Physical Exam patient not toxic  Blood pressure was taken laying sitting standing was stable. Lungs were clear no crackles heart is regular no murmurs abdomen is soft no guarding rebound or tenderness flanks mild tenderness on the left side extremities no edema  Urinalysis was viewed had RBCs no WBCs      Assessment & Plan:  Probable kidney stone CT scan not indicated. Too much risk of excess radiation. The importance of watching diet drinking plenty of liquids and use pain medicine when necessary cautioned drowsiness in may have to start Cipro if any dysuria or low-grade fever with this.  Patient was cautioned to stay home the next couple days rest up if he has any unusual symptoms progressing such as severe body aches high fevers cough runny nose sore throat it is probably the flu and he needs to let us know.

## 2013-04-24 ENCOUNTER — Ambulatory Visit (INDEPENDENT_AMBULATORY_CARE_PROVIDER_SITE_OTHER): Payer: PRIVATE HEALTH INSURANCE | Admitting: Family Medicine

## 2013-04-24 ENCOUNTER — Encounter: Payer: Self-pay | Admitting: Family Medicine

## 2013-04-24 VITALS — BP 128/80 | Temp 98.8°F | Ht 67.0 in | Wt 329.0 lb

## 2013-04-24 DIAGNOSIS — J329 Chronic sinusitis, unspecified: Secondary | ICD-10-CM

## 2013-04-24 MED ORDER — BENZONATATE 100 MG PO CAPS: 100.0000 mg | ORAL_CAPSULE | Freq: Four times a day (QID) | ORAL | Status: DC | PRN

## 2013-04-24 MED ORDER — LEVOFLOXACIN 500 MG PO TABS: 500.0000 mg | ORAL_TABLET | Freq: Every day | ORAL | Status: DC

## 2013-04-24 NOTE — Progress Notes (Signed)
   Subjective:    Patient ID: Jose Adkins, male    DOB: [birth year], 38 y.o.   MRN: [id number]  Cough This is a new problem. The current episode started yesterday. Associated symptoms include a fever, headaches and myalgias. Associated symptoms comments: Runny nose. Treatments tried: aleve cold and sinus. The treatment provided no relief.   Sun night little runny nose  Head felt bad and huge  Took aleave cold and sinus  Felt a little better  yest morn felt bad and out of it, and slept  Low gr 99  Some productive not bad with cough   Energy level bad A ot f walking last night Legs knees joints hips     Review of Systems  Constitutional: Positive for fever.  Respiratory: Positive for cough.   Musculoskeletal: Positive for myalgias.  Neurological: Positive for headaches.   ROS otherwise negative     Objective:   Physical Exam  Alert mild malaise. H&T moderate his congestion frontal tenderness pharynx normal neck supple. Lungs intermittent cough exam heart rare rhythm.      Assessment & Plan:  Impression post viral rhinosinusitis and bronchitis plan Levaquin 500 daily 10 days. Tessalon Perles when necessary. Symptomatic care discussed. WSL

## 2013-04-29 ENCOUNTER — Telehealth: Payer: Self-pay | Admitting: Family Medicine

## 2013-04-29 MED ORDER — CEFPROZIL 500 MG PO TABS: 500.0000 mg | ORAL_TABLET | Freq: Two times a day (BID) | ORAL | Status: DC

## 2013-04-29 NOTE — Telephone Encounter (Signed)
Notified patient may use decongestant for next few days, also add Cefzil 500 one bid for 7 days, if ongoing we will be happy to check him again. Patient verbalized understanding.

## 2013-04-29 NOTE — Telephone Encounter (Signed)
Patient diagnosed with flu last week and called today and said that antibiotics have helped with symptoms he was having, but he is now having a bad sore throat and ear pain. Please advise.   [facility]

## 2013-04-29 NOTE — Telephone Encounter (Signed)
Patient states that he is experiencing bad sore throat, left ear pain and his ear pops while swallowing. No fever, cough, wheezing or sob.

## 2013-04-29 NOTE — Telephone Encounter (Signed)
May use decongestant for next few days, also add Cefzil 500 one bid for 7 days, if ongoing we will be happy to check him again

## 2013-05-13 ENCOUNTER — Ambulatory Visit (INDEPENDENT_AMBULATORY_CARE_PROVIDER_SITE_OTHER): Payer: PRIVATE HEALTH INSURANCE | Admitting: Family Medicine

## 2013-05-13 ENCOUNTER — Ambulatory Visit: Payer: PRIVATE HEALTH INSURANCE | Admitting: Family Medicine

## 2013-05-13 ENCOUNTER — Encounter: Payer: Self-pay | Admitting: Family Medicine

## 2013-05-13 VITALS — BP 138/86 | Ht 67.0 in

## 2013-05-13 DIAGNOSIS — M722 Plantar fascial fibromatosis: Secondary | ICD-10-CM

## 2013-05-13 DIAGNOSIS — Z808 Family history of malignant neoplasm of other organs or systems: Secondary | ICD-10-CM

## 2013-05-13 MED ORDER — NAPROXEN 500 MG PO TABS: 500.0000 mg | ORAL_TABLET | Freq: Two times a day (BID) | ORAL | Status: DC

## 2013-05-13 NOTE — Progress Notes (Signed)
   Subjective:    Patient ID: Jose Adkins, male    DOB: Apr 15, 1975, 38 y.o.   MRN: [id number]  HPI Patient arrives with right foot pain and swelling for one week. Patient reports no known injury-walks 8 hours a day at work 6 days a week. Patient stand diagnosed with melanoma it concerns him.  Review of Systems Patient denies any wheezing fevers vomiting relates pain in the foot denies any abnormal skin areas    Objective:   Physical Exam Skin exam no abnormal moles noted on chest abdomen back legs and arms neck or scalp. Patient has plenty of moles but none of them atypical Tender right plantar fascia some swelling noted ankle normal no sign of gout       Assessment & Plan:  Patient to educate himself at the [place] website regarding melanoma if he ever sees abnormal mole he needs to followup immediately  Plantar fasciitis stretching, anti-inflammatory, cold compresses, if not improved in the next 1-2 weeks referral to podiatry.

## 2013-07-23 ENCOUNTER — Telehealth: Payer: Self-pay | Admitting: Family Medicine

## 2013-07-23 NOTE — Telephone Encounter (Signed)
Pt has been put on Chlorzoxazon 500 mg  For muscle relaxer for his back  Pt has been having difficulty with it over the past few days  Is unable to sleep, he's been icing an heating it with no help   Please call into [facility]

## 2013-07-23 NOTE — Telephone Encounter (Signed)
Patient has been taking Chlorzoxazone 500 mg for his back and it is not helping. He has also been applying ice and heat to his back without any relief. What do you recommend at this point?

## 2013-07-23 NOTE — Telephone Encounter (Signed)
The difficult part is with back pain the approach is a combination of stretching exercises, he can certainly try heat or ice, use anti-inflammatory twice a day such as Naprosyn which she he has been prescribed, use pain medication when absolutely necessary, muscle relaxers just help with spasms they really do not do much to help with pain. We can try a different anti-inflammatory if he would like. Ultimately if it continues to bother him especially if he starts having severe sciatica he would need to do an office visit for further evaluation. I am sympathetic to what is going on with him but there are no easy answers.

## 2013-07-23 NOTE — Telephone Encounter (Signed)
Nurses-please call patient. Try to clarify what the issue is actually is in regards to what problem he is having the intensity of the problem what he is trying for the problem in how we can help.

## 2013-07-24 MED ORDER — CHLORZOXAZONE 500 MG PO TABS: 500.0000 mg | ORAL_TABLET | Freq: Three times a day (TID) | ORAL | Status: DC | PRN

## 2013-07-24 MED ORDER — NAPROXEN 500 MG PO TABS: 500.0000 mg | ORAL_TABLET | Freq: Two times a day (BID) | ORAL | Status: DC

## 2013-07-24 NOTE — Telephone Encounter (Signed)
Patient would like the Naprosyn and a refill on the Chlorzoxazone sent to [facility]. He will call back and schedule an appointment to come in and be seen he states.

## 2013-07-24 NOTE — Telephone Encounter (Signed)
May have a prescription for Naprosyn 500 mg 1 twice a day #60 with 1 refill, chlorzoxazone 500 mg 3 times a day when necessary spasms, #45, 1 refill cautioned drowsiness

## 2013-07-24 NOTE — Telephone Encounter (Signed)
Medication was sent to pharmacy. Patient already aware this would be done.

## 2013-07-27 ENCOUNTER — Other Ambulatory Visit: Payer: Self-pay | Admitting: Family Medicine

## 2013-08-13 ENCOUNTER — Other Ambulatory Visit: Payer: Self-pay | Admitting: Family Medicine

## 2013-08-14 NOTE — Telephone Encounter (Signed)
May have this +4 additional refills 

## 2013-08-14 NOTE — Telephone Encounter (Signed)
Seen 2/23

## 2013-08-29 ENCOUNTER — Other Ambulatory Visit: Payer: Self-pay | Admitting: Family Medicine

## 2013-09-23 ENCOUNTER — Other Ambulatory Visit: Payer: Self-pay | Admitting: Family Medicine

## 2013-10-06 ENCOUNTER — Other Ambulatory Visit: Payer: Self-pay | Admitting: Family Medicine

## 2013-10-24 ENCOUNTER — Other Ambulatory Visit: Payer: Self-pay | Admitting: Family Medicine

## 2013-10-25 NOTE — Telephone Encounter (Signed)
Last seen 05/13/13

## 2013-11-06 ENCOUNTER — Ambulatory Visit (INDEPENDENT_AMBULATORY_CARE_PROVIDER_SITE_OTHER): Payer: PRIVATE HEALTH INSURANCE | Admitting: Family Medicine

## 2013-11-06 ENCOUNTER — Encounter: Payer: Self-pay | Admitting: Family Medicine

## 2013-11-06 VITALS — BP 126/84 | Ht 67.0 in | Wt 369.0 lb

## 2013-11-06 DIAGNOSIS — F411 Generalized anxiety disorder: Secondary | ICD-10-CM

## 2013-11-06 DIAGNOSIS — N200 Calculus of kidney: Secondary | ICD-10-CM

## 2013-11-06 DIAGNOSIS — M545 Low back pain, unspecified: Secondary | ICD-10-CM

## 2013-11-06 DIAGNOSIS — G4700 Insomnia, unspecified: Secondary | ICD-10-CM

## 2013-11-06 MED ORDER — OXYCODONE-ACETAMINOPHEN 10-325 MG PO TABS: 1.0000 | ORAL_TABLET | ORAL | Status: DC | PRN

## 2013-11-06 MED ORDER — ZOLPIDEM TARTRATE 10 MG PO TABS: ORAL_TABLET | ORAL | Status: DC

## 2013-11-06 MED ORDER — FLUOXETINE HCL 20 MG PO CAPS: ORAL_CAPSULE | ORAL | Status: DC

## 2013-11-06 MED ORDER — NAPROXEN 500 MG PO TABS: ORAL_TABLET | ORAL | Status: DC

## 2013-11-06 NOTE — Progress Notes (Signed)
   Subjective:    Patient ID: Jose Adkins, male    DOB: 04-03-1975, 38 y.o.   MRN: [id number]  HPI This patient was seen today for chronic pain  The medication list was reviewed and updated.   -Compliance with pain medication: takes as prescribed  The patient was advised the importance of maintaining medication and not using illegal substances with these.  Refills needed: yes percocet, naproxen, chlorzoxazone  The patient was educated that we can provide 3 monthly scripts for their medication, it is their responsibility to follow the instructions.  Side effects or complications from medications: none  Patient is aware that pain medications are meant to minimize the severity of the pain to allow their pain levels to improve to allow for better function. They are aware of that pain medications cannot totally remove their pain.  Due for UDT ( at least once per year) : None needed he is not on chronic meds Long discussion held regarding insomnia. He does best on the [place] he does not try to drive or do any activity while taking it  He understands to give himself at least 8-9 hours on medicine  As orthopedic pain and discomfort in his back and joints uses Naprosyn for this this helps.  Occasionally with kidney stones use of Percocet rarely for this does not abuse  Uses Prozac for mood disorder helps him greatly he would like to continue it denies being depressed denies being suicidal        Review of Systems     Objective:   Physical Exam  Significant obesity Lungs are clear heart is regular pulses normal Extremities no edema      Assessment & Plan:  History kidney stones I did give him a few Percocet she can have if necessary with a kidney stone occurs  Significant obesity patient encouraged to lose weight  Insomnia-Ambien helps he will continue no sparing  Mood disorder and does best on Prozac I would recommend continuing this.  Orthopedic discomfort  pain-Naprosyn twice a day I did discuss with him that this can cause ulcers and it could cause increased risk of heart disease although small patient is uses for long periods I would like to continue  Wellness labs and visit later in October November

## 2013-11-08 ENCOUNTER — Emergency Department (HOSPITAL_COMMUNITY)
Admission: EM | Admit: 2013-11-08 | Discharge: 2013-11-08 | Disposition: A | Discharge status: Home / Self Care | Payer: PRIVATE HEALTH INSURANCE | Attending: Emergency Medicine | Admitting: Emergency Medicine

## 2013-11-08 ENCOUNTER — Encounter (HOSPITAL_COMMUNITY): Payer: Self-pay | Admitting: Emergency Medicine

## 2013-11-08 DIAGNOSIS — Z8719 Personal history of other diseases of the digestive system: Secondary | ICD-10-CM | POA: Diagnosis not present

## 2013-11-08 DIAGNOSIS — Z792 Long term (current) use of antibiotics: Secondary | ICD-10-CM | POA: Diagnosis not present

## 2013-11-08 DIAGNOSIS — F329 Major depressive disorder, single episode, unspecified: Secondary | ICD-10-CM | POA: Diagnosis not present

## 2013-11-08 DIAGNOSIS — G4700 Insomnia, unspecified: Secondary | ICD-10-CM | POA: Insufficient documentation

## 2013-11-08 DIAGNOSIS — F411 Generalized anxiety disorder: Secondary | ICD-10-CM | POA: Insufficient documentation

## 2013-11-08 DIAGNOSIS — R041 Hemorrhage from throat: Secondary | ICD-10-CM | POA: Diagnosis not present

## 2013-11-08 DIAGNOSIS — Z87442 Personal history of urinary calculi: Secondary | ICD-10-CM | POA: Insufficient documentation

## 2013-11-08 DIAGNOSIS — R042 Hemoptysis: Secondary | ICD-10-CM | POA: Diagnosis present

## 2013-11-08 DIAGNOSIS — F3289 Other specified depressive episodes: Secondary | ICD-10-CM | POA: Diagnosis not present

## 2013-11-08 DIAGNOSIS — Z791 Long term (current) use of non-steroidal anti-inflammatories (NSAID): Secondary | ICD-10-CM | POA: Diagnosis not present

## 2013-11-08 DIAGNOSIS — Z79899 Other long term (current) drug therapy: Secondary | ICD-10-CM | POA: Insufficient documentation

## 2013-11-08 MED ORDER — LIDOCAINE-EPINEPHRINE 2 %-1:100000 IJ SOLN
INTRAMUSCULAR | Status: AC
  Administered 2013-11-08: 18:00:00
  Filled 2013-11-08: qty 1

## 2013-11-08 MED ORDER — AMOXICILLIN 500 MG PO CAPS: 500.0000 mg | ORAL_CAPSULE | Freq: Three times a day (TID) | ORAL | Status: DC

## 2013-11-08 MED ORDER — BENZOCAINE 20 % MT SOLN
Freq: Once | OROMUCOSAL | Status: AC
  Administered 2013-11-08: 17:00:00 via OROMUCOSAL
  Filled 2013-11-08: qty 57

## 2013-11-08 NOTE — ED Notes (Signed)
Per EMS- called out to patient's work. Woke up with HA. Took B.C. Powder. No alleviation of symptoms. Around 1315 started coughing up bright red blood. Martin Majestic to PCP Wednesday and noted ulcer to left of the [place]). Not on blood thinner. Denies alcohol/drug use. A&Ox4. Ambulatory and moving   168/111 HR 80s. 12 lead unremarkable.

## 2013-11-08 NOTE — ED Notes (Signed)
MD at bedside. 

## 2013-11-08 NOTE — ED Provider Notes (Signed)
CSN: [id number]     Arrival date & time 11/08/13  1434 History   First MD Initiated Contact with Patient 11/08/13 1454     Chief Complaint  Patient presents with  . Hemoptysis      HPI  Patient presents with cough and followed by bleeding from his throat.  He's noticed a sore throat for the left side of his pharynx for several days. On Wednesday, 2 days ago, he saw his primary care physician for a routine follow up visit.  His physician noted a "ulcer" in his throat at that time.  He was working he coughed. He felt something "funny" in his throat. He states he had a strange taste in his mouth. His cough turned bloody. He has had mild to moderate hemoptysis since that time.  In his chest. No shortness of breath. Is not on any blood thinning medications.  Past Medical History  Diagnosis Date  . Kidney stone   . Anxiety   . Reflux   . Depression   . Low testosterone   . Kidney stones   . Sleep apnea    Past Surgical History  Procedure Laterality Date  . Vasectomy  2002   History reviewed. No pertinent family history. History  Substance Use Topics  . Smoking status: Never Smoker   . Smokeless tobacco: Not on file  . Alcohol Use: No    Review of Systems  Constitutional: Negative for fever, chills, diaphoresis, appetite change and fatigue.  HENT: Positive for mouth sores and sore throat. Negative for trouble swallowing.   Eyes: Negative for visual disturbance.  Respiratory: Negative for cough, chest tightness, shortness of breath and wheezing.        Hemoptysis  Cardiovascular: Negative for chest pain.  Gastrointestinal: Negative for nausea, vomiting, abdominal pain, diarrhea and abdominal distention.  Endocrine: Negative for polydipsia, polyphagia and polyuria.  Genitourinary: Negative for dysuria, frequency and hematuria.  Musculoskeletal: Negative for gait problem.  Skin: Negative for color change, pallor and rash.  Neurological: Negative for dizziness, syncope,  light-headedness and headaches.  Hematological: Does not bruise/bleed easily.  Psychiatric/Behavioral: Negative for behavioral problems and confusion.      Allergies  Review of patient's allergies indicates no known allergies.  Home Medications   Prior to Admission medications   Medication Sig Start Date End Date Taking? Authorizing Provider  FLUoxetine (PROZAC) 20 MG capsule Take 40 mg by mouth daily.   Yes Historical Provider, MD  naproxen (NAPROSYN) 500 MG tablet Take 500 mg by mouth 2 (two) times daily with a meal.   Yes Historical Provider, MD  zolpidem (AMBIEN) 10 MG tablet Take 10 mg by mouth at bedtime as needed for sleep.   Yes Historical Provider, MD  amoxicillin (AMOXIL) 500 MG capsule Take 1 capsule (500 mg total) by mouth 3 (three) times daily. 11/08/13   Tanna Furry, MD   BP 136/85  Pulse 77  Temp(Src) 98.8 F (37.1 C) (Oral)  Resp 16  SpO2 98% Physical Exam  Constitutional: He is oriented to person, place, and time. He appears well-developed and well-nourished. No distress.  HENT:  Head: Normocephalic.  Mouth/Throat:    Eyes: Conjunctivae are normal. Pupils are equal, round, and reactive to light. No scleral icterus.  Neck: Normal range of motion. Neck supple. No thyromegaly present.  Cardiovascular: Normal rate and regular rhythm.  Exam reveals no gallop and no friction rub.   No murmur heard. Pulmonary/Chest: Effort normal and breath sounds normal. No respiratory distress. He has no  wheezes. He has no rales.  Abdominal: Soft. Bowel sounds are normal. He exhibits no distension. There is no tenderness. There is no rebound.  Musculoskeletal: Normal range of motion.  Neurological: He is alert and oriented to person, place, and time.  Skin: Skin is warm and dry. No rash noted.  Psychiatric: He has a normal mood and affect. His behavior is normal.    ED Course  Procedures (including critical care time) Labs Review Labs Reviewed - No data to display  Imaging  Review No results found.   EKG Interpretation None      MDM   Final diagnoses:  Pharyngeal hemorrhage    Discussed the case with Dr. Radene Journey of ENT. He is acceptable to trials or nitrate cautery here. Patient has a large bulky short neck. Has a lot of redundant soft tissues as posterior pharynx. He has a strong gag reflex.  I was able to cauterize one small area of bleeding which slowed. However some continued mild amount moderate bleeding persisted. I discussed the case with Dr. Lucia Gaskins. He is present in the emergency room. He was able to anesthetize the area with lidocaine and epinephrine. This provided some hemostasis and silver nitrate cautery has further stopped his bleeding at this time. He's been observed an additional half an hour, and no additional bleeding noted.  Call Dr. Lucia Gaskins with any bleeding.  Liquid diet tonight.  Amoxicillin rx.    Tanna Furry, MD 11/08/13 [phone]

## 2013-11-08 NOTE — ED Notes (Addendum)
Initial contact-patient confirms EMS report. Does report minimal SOB and dizziness with activity. Per PCP, "the ulcer would go away in a few days. He told me I could use orajel, benadryl, or maalox." Patient didn't use any of these medications. Says that hasn't happened to him before. Not on any blood thinners. Denies night sweats, chills, chest pain, and fever. No other complaints/concerns. Awaiting MD.

## 2013-11-08 NOTE — ED Notes (Signed)
AVS explained in detail. Advised to follow up with MD. No other questions/concerns. Also advised to follow up with PCP regarding BP. No signs of bleeding at this time.

## 2013-11-08 NOTE — Consult Note (Signed)
Reason for Consult:bleeding from soft palate Referring Physician: [facility] ER  Jose Adkins is an 38 y.o. male.  HPI: Sore throat for couple of days and small ulcer noted. Started bleeding this afternoon around 2 and continued to bleed despite gargling and cauterization in ER. I was consulted to assist in stopping the bleeding.  Past Medical History  Diagnosis Date  . Kidney stone   . Anxiety   . Reflux   . Depression   . Low testosterone   . Kidney stones   . Sleep apnea     Past Surgical History  Procedure Laterality Date  . Vasectomy  2002    Social History:  reports that he has never smoked. He does not have any smokeless tobacco history on file. He reports that he does not drink alcohol or use illicit drugs.  Allergies: No Known Allergies  Medications: I have reviewed the patient's current medications.  No results found for this or any previous visit (from the past 48 hour(s)).  No results found.  GTX:MIWO throat for couple of days   PE:On exam bleeding from the soft palate just left of the uvula at the superior extent of the tonsil but tonsil not involved Area soft to palpation and no gross abnormality noted. Just small bleeding ulcer, Soft palate was sprayed with hurricane and injected with 2 cc xylocaine with epi Silver Nitrate was applied to the bleeding site and pressure stopped the bleeding  Assessment/Plan: Soft palate ulcer bleeding  Cauterized at bedside. Have patient follow up my office if any other bleeding or persistent sore throat  Jose Adkins 11/08/2013, 6:14 PM

## 2013-11-08 NOTE — ED Notes (Signed)
Bed: OF18 Expected date:  Expected time:  Means of arrival:  Comments: ems- coughing up blood, HTN

## 2013-11-08 NOTE — Discharge Instructions (Signed)
If you redevelop bleeding call Dr. Lucia Gaskins.  With a sudden or marked bleeding return to emergency room.  Liquid only diet tonight.

## 2013-11-11 ENCOUNTER — Encounter: Payer: Self-pay | Admitting: Family Medicine

## 2013-11-11 ENCOUNTER — Ambulatory Visit (INDEPENDENT_AMBULATORY_CARE_PROVIDER_SITE_OTHER): Payer: PRIVATE HEALTH INSURANCE | Admitting: Family Medicine

## 2013-11-11 VITALS — BP 164/108 | Temp 97.9°F | Ht 67.0 in | Wt 371.0 lb

## 2013-11-11 DIAGNOSIS — K1379 Other lesions of oral mucosa: Secondary | ICD-10-CM

## 2013-11-11 DIAGNOSIS — K1370 Unspecified lesions of oral mucosa: Secondary | ICD-10-CM

## 2013-11-11 DIAGNOSIS — K120 Recurrent oral aphthae: Secondary | ICD-10-CM

## 2013-11-11 LAB — CBC WITH DIFFERENTIAL/PLATELET
BASOS ABS: 0 10*3/uL (ref 0.0–0.1)
Basophils Relative: 0 % (ref 0–1)
EOS ABS: 0.1 10*3/uL (ref 0.0–0.7)
EOS PCT: 2 % (ref 0–5)
HCT: 41.5 % (ref 39.0–52.0)
Hemoglobin: 13.9 g/dL (ref 13.0–17.0)
Lymphocytes Relative: 38 % (ref 12–46)
Lymphs Abs: 2.3 10*3/uL (ref 0.7–4.0)
MCH: 28.4 pg (ref 26.0–34.0)
MCHC: 33.5 g/dL (ref 30.0–36.0)
MCV: 84.9 fL (ref 78.0–100.0)
Monocytes Absolute: 0.4 10*3/uL (ref 0.1–1.0)
Monocytes Relative: 7 % (ref 3–12)
NEUTROS PCT: 53 % (ref 43–77)
Neutro Abs: 3.2 10*3/uL (ref 1.7–7.7)
PLATELETS: 199 10*3/uL (ref 150–400)
RBC: 4.89 MIL/uL (ref 4.22–5.81)
RDW: 15 % (ref 11.5–15.5)
WBC: 6 10*3/uL (ref 4.0–10.5)

## 2013-11-11 MED ORDER — AMLODIPINE BESYLATE 2.5 MG PO TABS: 2.5000 mg | ORAL_TABLET | Freq: Every day | ORAL | Status: DC

## 2013-11-11 NOTE — Progress Notes (Signed)
   Subjective:    Patient ID: Jose Adkins, male    DOB: [birth year], 38 y.o.   MRN: [id number]  HPISeen in ED on 11/08/13. Diagnosed with pharyngeal hemorrhage. Pt is taking amoxil one tid. Still coughing up blood. Pt states it is coming up in clots. Feels like something stuck in throat. Following a liquid diet. No trouble breathing.   Was seen in the ER over the weekend then in [place]. They did use cautery to stop the bleeding. They even had Dr. Lucia Gaskins see him briefly in the ER  Patient is concerned. Has never had a history of this. Blood pressure at hospital was 167/115. Today BP 164/108. Pt states he has been having headaches since Friday.   Review of Systems Bleeding as per above denies chest pressure tightness pain relates some headaches over the weekend.    Objective:   Physical Exam  He does have a significant apthous ulcer on the left tonsillar region. There is no sign of active bleeding. No lymphadenopathy. Has narrow airway. Lungs clear no crackles respiratory rate normal Heart regular Pulse normal blood pressure elevated 154/100      Assessment & Plan:  HTN low-dose amlodipine. Blood pressure was normal before all this episode. Hopefully through diet watching weight and exercising he can come off blood pressure medicine followup within a few weeks  apthous ulcer with intermittent bleeding-referral to ENT they will evaluate to see if he needs further intervention. Patient very stressed over this period has had several episodes over the weekend where he had bleeding that concerned him. We will go ahead and check CBC, PT, PTT I suspect all of those will be normal. Await the result.

## 2013-11-12 LAB — APTT: aPTT: 37 seconds (ref 24–37)

## 2013-11-12 LAB — PROTIME-INR
INR: 1.04 (ref <1.50)
Prothrombin Time: 13.6 seconds (ref 11.6–15.2)

## 2013-12-02 ENCOUNTER — Telehealth: Payer: Self-pay | Admitting: Family Medicine

## 2013-12-02 MED ORDER — CHLORZOXAZONE 500 MG PO TABS: 500.0000 mg | ORAL_TABLET | Freq: Three times a day (TID) | ORAL | Status: DC | PRN

## 2013-12-02 NOTE — Telephone Encounter (Signed)
Rx sent electronically to pharmacy. Patient notified. 

## 2013-12-02 NOTE — Telephone Encounter (Signed)
May refill times 3 caution drowsiness

## 2013-12-02 NOTE — Telephone Encounter (Signed)
Patient said that he has been having issues with his back since last night due to doing more work at his job.  He said he has to walk a whole lot. It is really bothering him and having a lot of muscle spasms. Would like to know if he can have a refill of his chlorzoxazone (PARAFON) 500 MG tablet.   [facility]

## 2014-02-03 ENCOUNTER — Ambulatory Visit (INDEPENDENT_AMBULATORY_CARE_PROVIDER_SITE_OTHER): Payer: PRIVATE HEALTH INSURANCE | Admitting: Family Medicine

## 2014-02-03 ENCOUNTER — Encounter: Payer: Self-pay | Admitting: Family Medicine

## 2014-02-03 VITALS — BP 132/90 | Temp 98.4°F | Ht 67.0 in | Wt 372.0 lb

## 2014-02-03 DIAGNOSIS — J208 Acute bronchitis due to other specified organisms: Secondary | ICD-10-CM

## 2014-02-03 DIAGNOSIS — R0602 Shortness of breath: Secondary | ICD-10-CM

## 2014-02-03 DIAGNOSIS — R062 Wheezing: Secondary | ICD-10-CM

## 2014-02-03 MED ORDER — AZITHROMYCIN 250 MG PO TABS: ORAL_TABLET | ORAL | Status: DC

## 2014-02-03 MED ORDER — ALBUTEROL SULFATE (2.5 MG/3ML) 0.083% IN NEBU
2.5000 mg | INHALATION_SOLUTION | Freq: Once | RESPIRATORY_TRACT | Status: AC
  Administered 2014-02-03: 2.5 mg via RESPIRATORY_TRACT

## 2014-02-03 MED ORDER — ALBUTEROL SULFATE HFA 108 (90 BASE) MCG/ACT IN AERS: 2.0000 | INHALATION_SPRAY | Freq: Four times a day (QID) | RESPIRATORY_TRACT | Status: DC | PRN

## 2014-02-03 MED ORDER — PREDNISONE 20 MG PO TABS: ORAL_TABLET | ORAL | Status: DC

## 2014-02-03 NOTE — Progress Notes (Signed)
   Subjective:    Patient ID: Jose Adkins, male    DOB: 1975/06/06, 38 y.o.   MRN: [id number]  Cough This is a new problem. Episode onset: Friday. The problem has been gradually worsening. The cough is productive of purulent sputum. Associated symptoms include chest pain, chills, a fever, myalgias, nasal congestion, rhinorrhea, shortness of breath and wheezing. Pertinent negatives include no ear pain. Nothing aggravates the symptoms. He has tried OTC cough suppressant for the symptoms. The treatment provided mild relief.   PMH benign  Review of Systems  Constitutional: Positive for fever and chills. Negative for activity change.  HENT: Positive for congestion and rhinorrhea. Negative for ear pain.   Eyes: Negative for discharge.  Respiratory: Positive for cough, shortness of breath and wheezing.   Cardiovascular: Positive for chest pain.  Musculoskeletal: Positive for myalgias.       Objective:   Physical Exam  Constitutional: He appears well-developed.  HENT:  Head: Normocephalic.  Mouth/Throat: Oropharynx is clear and moist. No oropharyngeal exudate.  Neck: Normal range of motion.  Cardiovascular: Normal rate, regular rhythm and normal heart sounds.   No murmur heard. Pulmonary/Chest: Effort normal. He has wheezes.  Lymphadenopathy:    He has no cervical adenopathy.  Neurological: He exhibits normal muscle tone.  Skin: Skin is warm and dry.  Nursing note and vitals reviewed.    Nebulizer treatment given with some improvement wheezing went away     Assessment & Plan:  Progressive upper rest revealed a smaller bronchitis antibodies prescribe inhaler prescribed prednisone as well no need for x-rays lab work await how he does over the next few days

## 2014-02-05 ENCOUNTER — Ambulatory Visit (INDEPENDENT_AMBULATORY_CARE_PROVIDER_SITE_OTHER): Payer: PRIVATE HEALTH INSURANCE | Admitting: Family Medicine

## 2014-02-05 ENCOUNTER — Ambulatory Visit (HOSPITAL_COMMUNITY)
Admission: RE | Admit: 2014-02-05 | Discharge: 2014-02-05 | Disposition: A | Discharge status: Home / Self Care | Payer: PRIVATE HEALTH INSURANCE | Source: Ambulatory Visit | Attending: Family Medicine | Admitting: Family Medicine

## 2014-02-05 VITALS — BP 138/98 | HR 100 | Temp 99.0°F | Ht 67.0 in | Wt 372.0 lb

## 2014-02-05 DIAGNOSIS — R0989 Other specified symptoms and signs involving the circulatory and respiratory systems: Secondary | ICD-10-CM | POA: Insufficient documentation

## 2014-02-05 DIAGNOSIS — R05 Cough: Secondary | ICD-10-CM | POA: Insufficient documentation

## 2014-02-05 DIAGNOSIS — R062 Wheezing: Secondary | ICD-10-CM | POA: Insufficient documentation

## 2014-02-05 DIAGNOSIS — R0602 Shortness of breath: Secondary | ICD-10-CM

## 2014-02-05 DIAGNOSIS — J208 Acute bronchitis due to other specified organisms: Secondary | ICD-10-CM

## 2014-02-05 MED ORDER — ALBUTEROL SULFATE (2.5 MG/3ML) 0.083% IN NEBU
2.5000 mg | INHALATION_SOLUTION | Freq: Once | RESPIRATORY_TRACT | Status: AC
  Administered 2014-02-05: 2.5 mg via RESPIRATORY_TRACT

## 2014-02-05 NOTE — Progress Notes (Signed)
   Subjective:    Patient ID: Jose Adkins, male    DOB: 1975-03-23, 38 y.o.   MRN: [id number]  HPI  Please see the previous note Patient presents today with increased coughing wheezing did not get his him his inhaler filled  Review of Systems    patient relates wheezing coughing shortness of breath denies high fever chills sweats Objective:   Physical Exam  Bilateral expiratory wheezes some central chest congestion noted patient was some difficulty breathing but after 2 nebulizer treatments he improved eardrums normal throat normal      Assessment & Plan:  He is to finish his prednisone Finish his Zithromax Albuterol on a regular basis Should gradually get better over the next 7 days Work excuse given on his last visit  Patient was told that if he does not significantly turn the corner of the next 48 hours we will add an additional antibiotic He was told that his chest x-ray did not show pneumonia

## 2014-02-07 ENCOUNTER — Telehealth: Payer: Self-pay | Admitting: Family Medicine

## 2014-02-07 MED ORDER — AMOXICILLIN-POT CLAVULANATE 875-125 MG PO TABS: 1.0000 | ORAL_TABLET | Freq: Two times a day (BID) | ORAL | Status: DC

## 2014-02-07 NOTE — Telephone Encounter (Signed)
Augmentin 875, 1 bid 10 days , follow up if worse or ongoing

## 2014-02-07 NOTE — Telephone Encounter (Signed)
Patient notified and verbalized understanding. 

## 2014-02-07 NOTE — Addendum Note (Signed)
Addended byCharolotte Capuchin D on: 02/07/2014 11:08 AM   Modules accepted: Orders

## 2014-02-07 NOTE — Telephone Encounter (Signed)
Patient was seen on Wednesday and finished up zpk and now wants another antibotic called into [facility]. He states that feels a little better.

## 2014-02-15 ENCOUNTER — Other Ambulatory Visit: Payer: Self-pay | Admitting: Family Medicine

## 2014-02-21 ENCOUNTER — Encounter: Payer: PRIVATE HEALTH INSURANCE | Admitting: Family Medicine

## 2014-03-12 ENCOUNTER — Encounter: Payer: Self-pay | Admitting: Family Medicine

## 2014-03-12 ENCOUNTER — Ambulatory Visit (INDEPENDENT_AMBULATORY_CARE_PROVIDER_SITE_OTHER): Payer: PRIVATE HEALTH INSURANCE | Admitting: Family Medicine

## 2014-03-12 ENCOUNTER — Encounter (INDEPENDENT_AMBULATORY_CARE_PROVIDER_SITE_OTHER): Payer: Self-pay

## 2014-03-12 VITALS — BP 128/86 | Ht 67.0 in | Wt 371.8 lb

## 2014-03-12 DIAGNOSIS — Z23 Encounter for immunization: Secondary | ICD-10-CM

## 2014-03-12 DIAGNOSIS — Z0000 Encounter for general adult medical examination without abnormal findings: Secondary | ICD-10-CM

## 2014-03-12 MED ORDER — AMLODIPINE BESYLATE 5 MG PO TABS: 5.0000 mg | ORAL_TABLET | Freq: Every day | ORAL | Status: DC

## 2014-03-12 NOTE — Progress Notes (Signed)
   Subjective:    Patient ID: Jose Adkins, male    DOB: 1975-08-22, 38 y.o.   MRN: [id number]  HPI  The patient comes in today for a wellness visit.    A review of their health history was completed.  A review of medications was also completed.  Any needed refills; doesn't think so so  Eating habits: could eat beter  Falls/  MVA accidents in past few months: none  Regular exercise: walking  Specialist pt sees on regular basis: no  Preventative health issues were discussed.   Additional concerns: discuss labs from work   Review of Systems  Constitutional: Negative for fever, activity change and appetite change.  HENT: Negative for congestion and rhinorrhea.   Eyes: Negative for discharge.  Respiratory: Negative for cough and wheezing.   Cardiovascular: Negative for chest pain.  Gastrointestinal: Negative for vomiting, abdominal pain and blood in stool.  Genitourinary: Negative for frequency and difficulty urinating.  Musculoskeletal: Negative for neck pain.  Skin: Negative for rash.  Allergic/Immunologic: Negative for environmental allergies and food allergies.  Neurological: Negative for weakness and headaches.  Psychiatric/Behavioral: Negative for agitation.       Objective:   Physical Exam  Constitutional: He appears well-developed and well-nourished.  HENT:  Head: Normocephalic and atraumatic.  Right Ear: External ear normal.  Left Ear: External ear normal.  Nose: Nose normal.  Mouth/Throat: Oropharynx is clear and moist.  Eyes: EOM are normal. Pupils are equal, round, and reactive to light.  Neck: Normal range of motion. Neck supple. No thyromegaly present.  Cardiovascular: Normal rate, regular rhythm and normal heart sounds.   No murmur heard. Pulmonary/Chest: Effort normal and breath sounds normal. No respiratory distress. He has no wheezes.  Abdominal: Soft. Bowel sounds are normal. He exhibits no distension and no mass. There is no tenderness.    Genitourinary: Penis normal.  Musculoskeletal: Normal range of motion. He exhibits no edema.  Lymphadenopathy:    He has no cervical adenopathy.  Neurological: He is alert. He exhibits normal muscle tone.  Skin: Skin is warm and dry. No erythema.  Psychiatric: He has a normal mood and affect. His behavior is normal. Judgment normal.          Assessment & Plan:  #1 patient counseled on regular exercise #2 patient counseled on dietary measures #3 blood pressure subpar increase Norvasc 5 mg daily follow-up 4-6 weeks #4 patient counseled regarding weight to consider gastric bypass if he needs a referral he will let us know we will help him with this Hemoccult cards because patient states he saw small amount of blood when he wiped he denies any blood in the stools  Safety dietary measures discussed

## 2014-05-19 ENCOUNTER — Other Ambulatory Visit: Payer: Self-pay | Admitting: Family Medicine

## 2014-05-20 NOTE — Telephone Encounter (Signed)
May have this +4 refills 

## 2014-05-23 ENCOUNTER — Other Ambulatory Visit: Payer: Self-pay | Admitting: Family Medicine

## 2014-05-25 NOTE — Telephone Encounter (Signed)
Okay this refill +4 additional refills

## 2014-06-17 ENCOUNTER — Encounter: Payer: Self-pay | Admitting: Family Medicine

## 2014-06-17 ENCOUNTER — Ambulatory Visit (INDEPENDENT_AMBULATORY_CARE_PROVIDER_SITE_OTHER): Payer: PRIVATE HEALTH INSURANCE | Admitting: Family Medicine

## 2014-06-17 VITALS — BP 128/86 | Temp 98.7°F | Ht 67.0 in | Wt 372.0 lb

## 2014-06-17 DIAGNOSIS — R300 Dysuria: Secondary | ICD-10-CM | POA: Diagnosis not present

## 2014-06-17 DIAGNOSIS — N200 Calculus of kidney: Secondary | ICD-10-CM

## 2014-06-17 LAB — POCT URINALYSIS DIPSTICK
RBC UA: 250
Spec Grav, UA: 1.02
pH, UA: 7

## 2014-06-17 MED ORDER — OXYCODONE-ACETAMINOPHEN 10-325 MG PO TABS: 1.0000 | ORAL_TABLET | ORAL | Status: DC | PRN

## 2014-06-17 MED ORDER — TAMSULOSIN HCL 0.4 MG PO CAPS: 0.4000 mg | ORAL_CAPSULE | Freq: Every day | ORAL | Status: DC

## 2014-06-17 MED ORDER — ONDANSETRON HCL 8 MG PO TABS: 8.0000 mg | ORAL_TABLET | Freq: Three times a day (TID) | ORAL | Status: DC | PRN

## 2014-06-17 MED ORDER — LEVOFLOXACIN 500 MG PO TABS: 500.0000 mg | ORAL_TABLET | Freq: Every day | ORAL | Status: DC

## 2014-06-17 NOTE — Progress Notes (Signed)
   Subjective:    Patient ID: Jose Adkins, male    DOB: 1975-12-13, 39 y.o.   MRN: [id number]  Dysuria  This is a new problem. The current episode started yesterday. Associated symptoms include frequency and hematuria. Treatments tried: ibuprofen, flomax.    Patient relates severe pain earlier today not as bad currently some mild aching discomfort on the left side.  Review of Systems  Genitourinary: Positive for dysuria, frequency and hematuria.   Denies fever    Objective:   Physical Exam  Lungs clear heart regular flank moderate tenderness lower abdomen some mild tenderness rectal exam normal Patient does not seem toxic whatsoever Urinalysis with RBCs    Assessment & Plan:  Probable kidney stone Percocet for pain caution drowsiness Zofran for nausea Flomax as directed for the next several days strain the urine is able to get a stone bring it back for analysis  If severe pain or worse immediately go to the ER  If persistent discomfort into early next week I recommend CAT scan. Offered referral to urology if patient desires he has a history kidney stones

## 2014-09-04 ENCOUNTER — Encounter: Payer: Self-pay | Admitting: Family Medicine

## 2014-09-04 ENCOUNTER — Ambulatory Visit (INDEPENDENT_AMBULATORY_CARE_PROVIDER_SITE_OTHER): Payer: PRIVATE HEALTH INSURANCE | Admitting: Family Medicine

## 2014-09-04 VITALS — BP 124/90 | Temp 97.4°F | Ht 67.0 in | Wt 370.0 lb

## 2014-09-04 DIAGNOSIS — R319 Hematuria, unspecified: Secondary | ICD-10-CM | POA: Diagnosis not present

## 2014-09-04 LAB — POCT URINALYSIS DIPSTICK
Blood, UA: 250
PH UA: 5
Spec Grav, UA: >=1.03

## 2014-09-04 MED ORDER — CIPROFLOXACIN HCL 500 MG PO TABS: 500.0000 mg | ORAL_TABLET | Freq: Two times a day (BID) | ORAL | Status: AC

## 2014-09-04 NOTE — Progress Notes (Signed)
   Subjective:    Patient ID: Jose Adkins, male    DOB: [birth year], 39 y.o.   MRN: [id number]  Hematuria This is a new problem. Episode onset: 24 hours. Associated symptoms include dysuria and fever.  tried flomax.   Did not feel well  Some dys and incr frequency  Diminished   Felt feverish  Sweated quite a bit  Burning  No hx of prostte, history of positive kidney stones.  Saw Dr. Nicki Reaper a couple months ago for acute urinary symptoms.  Has not had any classic flank or obstructive type pain.  Felt he was getting some fever today.  Significant dysuria last couple days and gross hematuria yesterday  [phone]   Review of Systems  Constitutional: Positive for fever.  Genitourinary: Positive for dysuria and hematuria.   positive nausea no vomiting moderate malaise     Objective:   Physical Exam  Alert vitals stable HET normal lungs clear left CVA tenderness abdomen mild suprapubic tenderness good bowel sounds no discrete guarding or rebound  Urinalysis intermittent red blood cells with several white blood cells per high-power field    Assessment & Plan:  Impression gross hematuria with known kidney stones. Last scan several years ago. With gross hematuria and patient's age and reemergence of symptoms time to press on with urology referral discussed plan as above. Add Cipro twice a day. Patient Selena Batten has narcotics. Hydrate. Warning signs discussed WSL

## 2014-09-24 ENCOUNTER — Encounter: Payer: Self-pay | Admitting: Family Medicine

## 2014-10-29 ENCOUNTER — Emergency Department (HOSPITAL_COMMUNITY): Payer: PRIVATE HEALTH INSURANCE

## 2014-10-29 ENCOUNTER — Encounter (HOSPITAL_COMMUNITY): Payer: Self-pay

## 2014-10-29 ENCOUNTER — Emergency Department (HOSPITAL_COMMUNITY)
Admission: EM | Admit: 2014-10-29 | Discharge: 2014-10-29 | Disposition: A | Discharge status: Home / Self Care | Payer: PRIVATE HEALTH INSURANCE | Attending: Emergency Medicine | Admitting: Emergency Medicine

## 2014-10-29 ENCOUNTER — Telehealth: Payer: Self-pay | Admitting: *Deleted

## 2014-10-29 DIAGNOSIS — Z8639 Personal history of other endocrine, nutritional and metabolic disease: Secondary | ICD-10-CM | POA: Diagnosis not present

## 2014-10-29 DIAGNOSIS — R1030 Lower abdominal pain, unspecified: Secondary | ICD-10-CM | POA: Diagnosis not present

## 2014-10-29 DIAGNOSIS — R110 Nausea: Secondary | ICD-10-CM | POA: Insufficient documentation

## 2014-10-29 DIAGNOSIS — F329 Major depressive disorder, single episode, unspecified: Secondary | ICD-10-CM | POA: Insufficient documentation

## 2014-10-29 DIAGNOSIS — Z8669 Personal history of other diseases of the nervous system and sense organs: Secondary | ICD-10-CM | POA: Insufficient documentation

## 2014-10-29 DIAGNOSIS — Z87442 Personal history of urinary calculi: Secondary | ICD-10-CM | POA: Insufficient documentation

## 2014-10-29 DIAGNOSIS — F419 Anxiety disorder, unspecified: Secondary | ICD-10-CM | POA: Insufficient documentation

## 2014-10-29 DIAGNOSIS — Z8719 Personal history of other diseases of the digestive system: Secondary | ICD-10-CM | POA: Diagnosis not present

## 2014-10-29 DIAGNOSIS — Z79899 Other long term (current) drug therapy: Secondary | ICD-10-CM | POA: Insufficient documentation

## 2014-10-29 DIAGNOSIS — R1032 Left lower quadrant pain: Secondary | ICD-10-CM

## 2014-10-29 LAB — COMPREHENSIVE METABOLIC PANEL
ALT: 32 U/L (ref 17–63)
AST: 29 U/L (ref 15–41)
Albumin: 4 g/dL (ref 3.5–5.0)
Alkaline Phosphatase: 73 U/L (ref 38–126)
Anion gap: 9 (ref 5–15)
BILIRUBIN TOTAL: 0.5 mg/dL (ref 0.3–1.2)
BUN: 14 mg/dL (ref 6–20)
CALCIUM: 9 mg/dL (ref 8.9–10.3)
CO2: 28 mmol/L (ref 22–32)
Chloride: 102 mmol/L (ref 101–111)
Creatinine, Ser: 1.33 mg/dL — ABNORMAL HIGH (ref 0.61–1.24)
GLUCOSE: 101 mg/dL — AB (ref 65–99)
Potassium: 4.7 mmol/L (ref 3.5–5.1)
SODIUM: 139 mmol/L (ref 135–145)
Total Protein: 7.2 g/dL (ref 6.5–8.1)

## 2014-10-29 LAB — CBC WITH DIFFERENTIAL/PLATELET
BASOS PCT: 0 % (ref 0–1)
Basophils Absolute: 0 10*3/uL (ref 0.0–0.1)
Eosinophils Absolute: 0.1 10*3/uL (ref 0.0–0.7)
Eosinophils Relative: 2 % (ref 0–5)
HCT: 44.1 % (ref 39.0–52.0)
HEMOGLOBIN: 14.3 g/dL (ref 13.0–17.0)
Lymphocytes Relative: 39 % (ref 12–46)
Lymphs Abs: 2.7 10*3/uL (ref 0.7–4.0)
MCH: 28.4 pg (ref 26.0–34.0)
MCHC: 32.4 g/dL (ref 30.0–36.0)
MCV: 87.7 fL (ref 78.0–100.0)
Monocytes Absolute: 0.4 10*3/uL (ref 0.1–1.0)
Monocytes Relative: 5 % (ref 3–12)
NEUTROS PCT: 54 % (ref 43–77)
Neutro Abs: 3.8 10*3/uL (ref 1.7–7.7)
Platelets: 178 10*3/uL (ref 150–400)
RBC: 5.03 MIL/uL (ref 4.22–5.81)
RDW: 14.1 % (ref 11.5–15.5)
WBC: 6.9 10*3/uL (ref 4.0–10.5)

## 2014-10-29 LAB — URINALYSIS, ROUTINE W REFLEX MICROSCOPIC
Bilirubin Urine: NEGATIVE
Glucose, UA: NEGATIVE mg/dL
Ketones, ur: NEGATIVE mg/dL
LEUKOCYTES UA: NEGATIVE
Nitrite: NEGATIVE
PH: 6.5 (ref 5.0–8.0)
PROTEIN: NEGATIVE mg/dL
SPECIFIC GRAVITY, URINE: 1.015 (ref 1.005–1.030)
Urobilinogen, UA: 0.2 mg/dL (ref 0.0–1.0)

## 2014-10-29 LAB — URINE MICROSCOPIC-ADD ON

## 2014-10-29 LAB — POC OCCULT BLOOD, ED: FECAL OCCULT BLD: NEGATIVE

## 2014-10-29 MED ORDER — PROMETHAZINE HCL 25 MG PO TABS: 25.0000 mg | ORAL_TABLET | Freq: Four times a day (QID) | ORAL | Status: DC | PRN

## 2014-10-29 MED ORDER — ONDANSETRON HCL 4 MG/2ML IJ SOLN
4.0000 mg | Freq: Once | INTRAMUSCULAR | Status: AC
  Administered 2014-10-29: 4 mg via INTRAVENOUS
  Filled 2014-10-29: qty 2

## 2014-10-29 MED ORDER — SODIUM CHLORIDE 0.9 % IV BOLUS (SEPSIS)
1000.0000 mL | Freq: Once | INTRAVENOUS | Status: AC
  Administered 2014-10-29: 1000 mL via INTRAVENOUS

## 2014-10-29 MED ORDER — HYOSCYAMINE SULFATE 0.125 MG SL SUBL: 0.1250 mg | SUBLINGUAL_TABLET | Freq: Four times a day (QID) | SUBLINGUAL | Status: DC | PRN

## 2014-10-29 MED ORDER — HYOSCYAMINE SULFATE 0.125 MG PO TABS
0.1250 mg | ORAL_TABLET | Freq: Once | ORAL | Status: AC
  Administered 2014-10-29: 0.125 mg via ORAL
  Filled 2014-10-29: qty 1

## 2014-10-29 NOTE — Discharge Instructions (Signed)
The cause of your abdominal pain was not identified today.  Get rechecked immediately if you develop any new or worrisome symptoms.  You have a kidney stone on the left side that should not be causing you any pain at this time.  Drink plenty of fluids.     Abdominal Pain Many things can cause abdominal pain. Usually, abdominal pain is not caused by a disease and will improve without treatment. It can often be observed and treated at home. Your health care provider will do a physical exam and possibly order blood tests and X-rays to help determine the seriousness of your pain. However, in many cases, more time must pass before a clear cause of the pain can be found. Before that point, your health care provider may not know if you need more testing or further treatment. HOME CARE INSTRUCTIONS  Monitor your abdominal pain for any changes. The following actions may help to alleviate any discomfort you are experiencing:  Only take over-the-counter or prescription medicines as directed by your health care provider.  Do not take laxatives unless directed to do so by your health care provider.  Try a clear liquid diet (broth, tea, or water) as directed by your health care provider. Slowly move to a bland diet as tolerated. SEEK MEDICAL CARE IF:  You have unexplained abdominal pain.  You have abdominal pain associated with nausea or diarrhea.  You have pain when you urinate or have a bowel movement.  You experience abdominal pain that wakes you in the night.  You have abdominal pain that is worsened or improved by eating food.  You have abdominal pain that is worsened with eating fatty foods.  You have a fever. SEEK IMMEDIATE MEDICAL CARE IF:   Your pain does not go away within 2 hours.  You keep throwing up (vomiting).  Your pain is felt only in portions of the abdomen, such as the right side or the left lower portion of the abdomen.  You pass bloody or black tarry stools. MAKE SURE  YOU:  Understand these instructions.   Will watch your condition.   Will get help right away if you are not doing well or get worse.  Document Released: 12/15/2004 Document Revised: 03/12/2013 Document Reviewed: 11/14/2012 [facility] Patient Information 2015 [place], [place]. This information is not intended to replace advice given to you by your health care provider. Make sure you discuss any questions you have with your health care provider.

## 2014-10-29 NOTE — ED Provider Notes (Signed)
CSN: [id number]   Arrival date & time 10/29/14 2951  History  This chart was scribed for  Jose Reichert, MD by Altamease Oiler, ED Scribe. This patient was seen in room APA14/APA14 and the patient's care was started at 9:10 AM.  Chief Complaint  Patient presents with  . Abdominal Pain    HPI The history is provided by the patient. No language interpreter was used.   Jose Adkins is a 39 y.o. male who presents to the Emergency Department complaining of constant left lower an mid upper abdominal pain with onset 3 days ago after lunch. The pain is described as squeezing and rated 9/10 in severity. No modifying factors. Minimal relief after Pepto Bismol. Associated symptoms include nausea, an episode black stool this morning around 4 PM, and episode of dizziness. Pt denies fever, vomiting, chest pain, dysuria, and hematuria. No known sick contact or suspicious food intact. No recent medication changes. No significant past surgical history. No personal history of cardiac disease. Sxs are moderate, constant, worsening.   Past Medical History  Diagnosis Date  . Kidney stone   . Anxiety   . Reflux   . Depression   . Low testosterone   . Kidney stones   . Sleep apnea     Past Surgical History  Procedure Laterality Date  . Vasectomy  2002    No family history on file.  Social History  Substance Use Topics  . Smoking status: Never Smoker   . Smokeless tobacco: None  . Alcohol Use: No     Review of Systems  Constitutional: Negative for fever.  Cardiovascular: Negative for chest pain.  Gastrointestinal: Positive for nausea and abdominal pain. Negative for vomiting and diarrhea.       Black stool  Genitourinary: Negative for dysuria and hematuria.  Neurological: Positive for dizziness.  All other systems reviewed and are negative.    Home Medications   Prior to Admission medications   Medication Sig Start Date End Date Taking? Authorizing Provider  albuterol (PROVENTIL  HFA;VENTOLIN HFA) 108 (90 BASE) MCG/ACT inhaler Inhale 2 puffs into the lungs every 6 (six) hours as needed for wheezing. 02/03/14  Yes Kathyrn Drown, MD  amLODipine (NORVASC) 5 MG tablet Take 1 tablet (5 mg total) by mouth daily. 03/12/14  Yes Kathyrn Drown, MD  chlorzoxazone (PARAFON) 500 MG tablet Take 1 tablet (500 mg total) by mouth 3 (three) times daily as needed for muscle spasms. 12/02/13  Yes Kathyrn Drown, MD  FLUoxetine (PROZAC) 20 MG capsule Take 40 mg by mouth daily.   Yes Historical Provider, MD  naproxen (NAPROSYN) 500 MG tablet Take 500 mg by mouth 2 (two) times daily as needed for mild pain or moderate pain.    Yes Historical Provider, MD  oxyCODONE-acetaminophen (PERCOCET) 10-325 MG per tablet Take 1 tablet by mouth every 4 (four) hours as needed for pain. 06/17/14  Yes Kathyrn Drown, MD  zolpidem (AMBIEN) 10 MG tablet TAKE ONE TABLET BY MOUTH AT BEDTIME AS NEEDED 05/26/14  Yes Kathyrn Drown, MD  hyoscyamine (LEVSIN/SL) 0.125 MG SL tablet Place 1 tablet (0.125 mg total) under the tongue every 6 (six) hours as needed for cramping. 10/29/14   Jose Reichert, MD  promethazine (PHENERGAN) 25 MG tablet Take 1 tablet (25 mg total) by mouth every 6 (six) hours as needed for nausea or vomiting. 10/29/14   Jose Reichert, MD  tamsulosin (FLOMAX) 0.4 MG CAPS capsule Take 1 capsule (0.4 mg total) by mouth  daily. Patient not taking: Reported on 10/29/2014 06/17/14   Kathyrn Drown, MD    Allergies  Review of patient's allergies indicates no known allergies.  Triage Vitals: BP 150/101 mmHg  Pulse 53  Temp(Src) 98.5 F (36.9 C) (Oral)  Resp 20  Ht 5\' 8"  (1.727 m)  Wt 370 lb (167.831 kg)  BMI 56.27 kg/m2  SpO2 99%  Physical Exam  Constitutional: He is oriented to person, place, and time. He appears well-developed and well-nourished.  HENT:  Head: Normocephalic and atraumatic.  Cardiovascular: Normal rate and regular rhythm.   No murmur heard. Pulmonary/Chest: Effort normal and breath  sounds normal. No respiratory distress.  Abdominal: Soft. There is tenderness. There is no rebound and no guarding.  Mild to moderate abdominal tenderness  Genitourinary:  Dark stool in the vault  Musculoskeletal: He exhibits no edema or tenderness.  Neurological: He is alert and oriented to person, place, and time.  Skin: Skin is warm and dry.  Psychiatric: He has a normal mood and affect. His behavior is normal.  Nursing note and vitals reviewed.   ED Course  Procedures   DIAGNOSTIC STUDIES: Oxygen Saturation is 99% on RA, normal by my interpretation.    COORDINATION OF CARE: 9:16 AM Discussed treatment plan which includes lab work, EKG, CT A/P with contrast, Zofran, hyoscyamine, and IVF with pt at bedside and pt agreed to plan.  Labs Review-  Labs Reviewed  COMPREHENSIVE METABOLIC PANEL - Abnormal; Notable for the following:    Glucose, Bld 101 (*)    Creatinine, Ser 1.33 (*)    All other components within normal limits  URINALYSIS, ROUTINE W REFLEX MICROSCOPIC (NOT AT [facility]) - Abnormal; Notable for the following:    Hgb urine dipstick TRACE (*)    All other components within normal limits  CBC WITH DIFFERENTIAL/PLATELET  URINE MICROSCOPIC-ADD ON  POC OCCULT BLOOD, ED    Imaging Review Ct Abdomen Pelvis Wo Contrast  10/29/2014   CLINICAL DATA:  Left lower quadrant pain, nausea starting Monday  EXAM: CT ABDOMEN AND PELVIS WITHOUT CONTRAST  TECHNIQUE: Multidetector CT imaging of the abdomen and pelvis was performed following the standard protocol without IV contrast.  COMPARISON:  06/18/2014  FINDINGS: The study is limited without IV contrast. There are streak artifacts from patient's large body habitus. Fatty infiltration of the liver. The lung bases are unremarkable.  Sagittal images of the spine are unremarkable. No calcified gallstones are noted within gallbladder. No aortic aneurysm. The pancreas, spleen and adrenal glands are unremarkable. Again noted horseshoe kidney.  Nonobstructive left renal calculus measures 8.5 mm.  No hydronephrosis or hydroureter.  Oral contrast material was given to the patient. No small bowel obstruction. No ascites or free air. No adenopathy.  There is no pericecal inflammation. Normal appendix partially visualized. The terminal ileum is unremarkable. Tiny umbilical hernia containing fat. No evidence of acute complication. Urinary bladder is unremarkable. No pelvic ascites or adenopathy. Prostate gland and seminal vesicles are unremarkable. There is no inguinal adenopathy. There is no colitis or diverticulitis.  IMPRESSION: 1. No acute inflammatory process within abdomen. Fatty infiltration of the liver. 2. Again noted horseshoe kidney deformity. Left nonobstructive nephrolithiasis. 3. No hydronephrosis or hydroureter. 4. No small bowel obstruction. 5. Normal appendix. No pericecal inflammation. No colitis or diverticulitis.   Electronically Signed   By: Lahoma Crocker M.D.   On: 10/29/2014 12:44    EKG Interpretation  Date/Time:  Wednesday October 29 2014 09:05:10 EDT Ventricular Rate:  50 PR Interval:  152 QRS Duration: 100 QT Interval:  490 QTC Calculation: 447 R Axis:   -17 Text Interpretation:  Sinus rhythm Borderline left axis deviation Low voltage, precordial leads Confirmed by Hazle Coca [phone]) on 10/29/2014 11:06:31 AM       MDM   Final diagnoses:  Lower abdominal pain   Patient here for evaluation of nausea and abdominal cramping. He reports black stool recently had Pepto-Bismol, heme negative. Clinically picture is not consistent with major GI bleed. CT scan obtained to evaluate for diverticulitis. CT scan without any evidence of acute process. Discussed with patient unclear source of lower abdominal pain. Discussed home care, return precautions, PCP follow-up.  I personally performed the services described in this documentation, which was scribed in my presence. The recorded information has been reviewed and is  accurate.     Jose Reichert, MD 10/30/14 [phone]

## 2014-10-29 NOTE — Telephone Encounter (Signed)
Pt called having abd pain for 2 days. Severe pain this am. Black stools started today. Pt advised to go directly to ED. Pt states he will get his wife to take him to [facility] ED.

## 2014-10-29 NOTE — ED Notes (Signed)
Pt reports abd cramping since Sunday with nausea, no vomiting.  Reports episode of diarrhea last night but had episode of black diarrhea this morning.

## 2014-10-31 ENCOUNTER — Encounter: Payer: Self-pay | Admitting: Family Medicine

## 2014-10-31 ENCOUNTER — Telehealth: Payer: Self-pay | Admitting: Gastroenterology

## 2014-10-31 ENCOUNTER — Telehealth: Payer: Self-pay

## 2014-10-31 ENCOUNTER — Ambulatory Visit (INDEPENDENT_AMBULATORY_CARE_PROVIDER_SITE_OTHER): Payer: PRIVATE HEALTH INSURANCE | Admitting: Family Medicine

## 2014-10-31 VITALS — BP 134/94 | Temp 98.5°F | Ht 67.0 in | Wt 371.4 lb

## 2014-10-31 DIAGNOSIS — R109 Unspecified abdominal pain: Secondary | ICD-10-CM

## 2014-10-31 LAB — BASIC METABOLIC PANEL
BUN/Creatinine Ratio: 13 (ref 8–19)
BUN: 17 mg/dL (ref 6–20)
CHLORIDE: 102 mmol/L (ref 97–108)
CO2: 25 mmol/L (ref 18–29)
Calcium: 8.9 mg/dL (ref 8.7–10.2)
Creatinine, Ser: 1.34 mg/dL — ABNORMAL HIGH (ref 0.76–1.27)
GFR calc non Af Amer: 66 mL/min/{1.73_m2} (ref >59)
GFR, EST AFRICAN AMERICAN: 77 mL/min/{1.73_m2} (ref >59)
GLUCOSE: 83 mg/dL (ref 65–99)
POTASSIUM: 4.1 mmol/L (ref 3.5–5.2)
Sodium: 136 mmol/L (ref 134–144)

## 2014-10-31 LAB — CBC WITH DIFFERENTIAL/PLATELET
BASOS ABS: 0 10*3/uL (ref 0.0–0.2)
Basos: 0 %
EOS (ABSOLUTE): 0.2 10*3/uL (ref 0.0–0.4)
Eos: 2 %
Hematocrit: 43.2 % (ref 37.5–51.0)
Hemoglobin: 14.6 g/dL (ref 12.6–17.7)
LYMPHS: 32 %
Lymphocytes Absolute: 2.4 10*3/uL (ref 0.7–3.1)
MCH: 28.5 pg (ref 26.6–33.0)
MCHC: 33.8 g/dL (ref 31.5–35.7)
MCV: 84 fL (ref 79–97)
MONOS ABS: 0.6 10*3/uL (ref 0.1–0.9)
Monocytes: 9 %
NEUTROS ABS: 4.3 10*3/uL (ref 1.4–7.0)
Neutrophils: 57 %
Platelets: 189 10*3/uL (ref 150–379)
RBC: 5.12 x10E6/uL (ref 4.14–5.80)
RDW: 15.7 % — ABNORMAL HIGH (ref 12.3–15.4)
WBC: 7.5 10*3/uL (ref 3.4–10.8)

## 2014-10-31 LAB — POCT HEMOGLOBIN: HEMOGLOBIN: 13.7 g/dL — AB (ref 14.1–18.1)

## 2014-10-31 NOTE — Telephone Encounter (Signed)
DR. Wolfgang Phoenix CALLED TO DISCUSS PT. BEGAN TO FEEL BAD @AUG   6. Pt seen in ED FOR NVD AND ABD PAIN-CT NEGATIV., PT TOOK PEPTO AND THEN SAW BLACK STOOLS.   RECOMMENDED C DIFF AND ROUTINE STOOL Cx. SUPPORTIVE CARE: HYDRATE, IMODIUM 1-2 A DAY. CONSIDER URGENT OPV AND/OR FLEX SIG V. TCS NEXT WEEK.

## 2014-10-31 NOTE — Progress Notes (Signed)
   Subjective:    Patient ID: Jose Adkins, male    DOB: [birth year], 39 y.o.   MRN: [id number]  Abdominal Pain This is a new problem. The current episode started in the past 7 days. The onset quality is sudden. The problem occurs constantly. The problem has been gradually worsening. The pain is located in the LLQ and LUQ. The pain is at a severity of 9/10. The pain is severe. The quality of the pain is sharp, dull and cramping. Associated symptoms include diarrhea, headaches and nausea. Associated symptoms comments: Black loose stools. Treatments tried: Levsin, Phenergan, Pepto. The treatment provided no relief. Prior diagnostic workup includes CT scan (CT Scan done at the ER.).    Patient states that he has had the abdominal pain started on Monday afternoon following lunch. Patient states that he went to the ER on Wednesday. The ER did a CT scan of the abdomen. Patient states that he has had a kidney stone and feels that this is associated possible with a kidney stone blockage. Patient states that stools are black and very loose. Patient states no other concerns this visit.  At times having bowel movements that are dark but was using Pepto-Bismol Then today with svere cramping pain left side with dark BM Review of Systems  Gastrointestinal: Positive for nausea, abdominal pain and diarrhea.  Neurological: Positive for headaches.  use Pepto Bismol which caused diarrhea to be dark     Objective:   Physical Exam  Constitutional: He appears well-nourished. No distress.  Cardiovascular: Normal rate, regular rhythm and normal heart sounds.   No murmur heard. Pulmonary/Chest: Effort normal and breath sounds normal. No respiratory distress.  Abdominal:  Tenderness noted in the abdomen on the left side no guarding or rebound more in the area of the sigmoid colon  Musculoskeletal: He exhibits no edema.  Lymphadenopathy:    He has no cervical adenopathy.  Neurological: He is alert.    Psychiatric: His behavior is normal.  Vitals reviewed.   Repeat lab work showed white count actually normal now creatinine slightly elevated sodium-potassium looks good Hemoccults cards year negative     Assessment & Plan:  Patient will be due in stool testing Possible colitis Hemoccults are was negative I recommend 3 additional Hemoccults cards Patient report to [place] on Monday how he is doing I did discuss case with gastroenterologist who recommended outpatient care for now and no antibiotics If ongoing troubles or worse may need colonoscopy.

## 2014-11-09 ENCOUNTER — Other Ambulatory Visit: Payer: Self-pay | Admitting: Family Medicine

## 2014-12-18 ENCOUNTER — Other Ambulatory Visit: Payer: Self-pay | Admitting: Family Medicine

## 2014-12-18 NOTE — Telephone Encounter (Signed)
May refill each prescription and for additional refills

## 2014-12-22 ENCOUNTER — Other Ambulatory Visit: Payer: Self-pay | Admitting: Family Medicine

## 2014-12-22 NOTE — Telephone Encounter (Signed)
May have 1 refill, not to take with pain medication

## 2015-01-17 ENCOUNTER — Other Ambulatory Visit: Payer: Self-pay | Admitting: Family Medicine

## 2015-01-19 NOTE — Telephone Encounter (Signed)
May have this and 4 refills, nurse's-please put on the prescription " not to take with pain medication"

## 2015-01-23 ENCOUNTER — Other Ambulatory Visit: Payer: Self-pay | Admitting: Family Medicine

## 2015-02-19 ENCOUNTER — Encounter: Payer: Self-pay | Admitting: Family Medicine

## 2015-02-19 ENCOUNTER — Ambulatory Visit (INDEPENDENT_AMBULATORY_CARE_PROVIDER_SITE_OTHER): Payer: BLUE CROSS/BLUE SHIELD | Admitting: Family Medicine

## 2015-02-19 VITALS — BP 140/80 | Temp 98.2°F | Ht 67.0 in | Wt 380.0 lb

## 2015-02-19 DIAGNOSIS — B9689 Other specified bacterial agents as the cause of diseases classified elsewhere: Secondary | ICD-10-CM

## 2015-02-19 DIAGNOSIS — J019 Acute sinusitis, unspecified: Secondary | ICD-10-CM

## 2015-02-19 DIAGNOSIS — I10 Essential (primary) hypertension: Secondary | ICD-10-CM | POA: Diagnosis not present

## 2015-02-19 MED ORDER — AMOXICILLIN 500 MG PO TABS: 500.0000 mg | ORAL_TABLET | Freq: Three times a day (TID) | ORAL | Status: DC

## 2015-02-19 MED ORDER — NAPROXEN 500 MG PO TABS: ORAL_TABLET | ORAL | Status: DC

## 2015-02-19 MED ORDER — HYDROCODONE-ACETAMINOPHEN 5-325 MG PO TABS
1.0000 | ORAL_TABLET | Freq: Four times a day (QID) | ORAL | Status: DC | PRN
Start: 2015-02-19 — End: 2015-09-08

## 2015-02-19 NOTE — Patient Instructions (Signed)
DASH Eating Plan  DASH stands for "Dietary Approaches to Stop Hypertension." The DASH eating plan is a healthy eating plan that has been shown to reduce high blood pressure (hypertension). Additional health benefits may include reducing the risk of type 2 diabetes mellitus, heart disease, and stroke. The DASH eating plan may also help with weight loss.  WHAT DO I NEED TO KNOW ABOUT THE DASH EATING PLAN?  For the DASH eating plan, you will follow these general guidelines:  · Choose foods with a percent daily value for sodium of less than 5% (as listed on the food label).  · Use salt-free seasonings or herbs instead of table salt or sea salt.  · Check with your health care provider or pharmacist before using salt substitutes.  · Eat lower-sodium products, often labeled as "lower sodium" or "no salt added."  · Eat fresh foods.  · Eat more vegetables, fruits, and low-fat dairy products.  · Choose whole grains. Look for the word "whole" as the first word in the ingredient list.  · Choose fish and skinless chicken or turkey more often than red meat. Limit fish, poultry, and meat to 6 oz (170 g) each day.  · Limit sweets, desserts, sugars, and sugary drinks.  · Choose heart-healthy fats.  · Limit cheese to 1 oz (28 g) per day.  · Eat more home-cooked food and less restaurant, buffet, and fast food.  · Limit fried foods.  · Cook foods using methods other than frying.  · Limit canned vegetables. If you do use them, rinse them well to decrease the sodium.  · When eating at a restaurant, ask that your food be prepared with less salt, or no salt if possible.  WHAT FOODS CAN I EAT?  Seek help from a dietitian for individual calorie needs.  Grains  Whole grain or whole wheat bread. Brown rice. Whole grain or whole wheat pasta. Quinoa, bulgur, and whole grain cereals. Low-sodium cereals. Corn or whole wheat flour tortillas. Whole grain cornbread. Whole grain crackers. Low-sodium crackers.  Vegetables  Fresh or frozen vegetables  (raw, steamed, roasted, or grilled). Low-sodium or reduced-sodium tomato and vegetable juices. Low-sodium or reduced-sodium tomato sauce and paste. Low-sodium or reduced-sodium canned vegetables.   Fruits  All fresh, canned (in natural juice), or frozen fruits.  Meat and Other Protein Products  Ground beef (85% or leaner), grass-fed beef, or beef trimmed of fat. Skinless chicken or turkey. Ground chicken or turkey. Pork trimmed of fat. All fish and seafood. Eggs. Dried beans, peas, or lentils. Unsalted nuts and seeds. Unsalted canned beans.  Dairy  Low-fat dairy products, such as skim or 1% milk, 2% or reduced-fat cheeses, low-fat ricotta or cottage cheese, or plain low-fat yogurt. Low-sodium or reduced-sodium cheeses.  Fats and Oils  Tub margarines without trans fats. Light or reduced-fat mayonnaise and salad dressings (reduced sodium). Avocado. Safflower, olive, or canola oils. Natural peanut or almond butter.  Other  Unsalted popcorn and pretzels.  The items listed above may not be a complete list of recommended foods or beverages. Contact your dietitian for more options.  WHAT FOODS ARE NOT RECOMMENDED?  Grains  White bread. White pasta. White rice. Refined cornbread. Bagels and croissants. Crackers that contain trans fat.  Vegetables  Creamed or fried vegetables. Vegetables in a cheese sauce. Regular canned vegetables. Regular canned tomato sauce and paste. Regular tomato and vegetable juices.  Fruits  Dried fruits. Canned fruit in light or heavy syrup. Fruit juice.  Meat and Other Protein   Products  Fatty cuts of meat. Ribs, chicken wings, bacon, sausage, bologna, salami, chitterlings, fatback, hot dogs, bratwurst, and packaged luncheon meats. Salted nuts and seeds. Canned beans with salt.  Dairy  Whole or 2% milk, cream, half-and-half, and cream cheese. Whole-fat or sweetened yogurt. Full-fat cheeses or blue cheese. Nondairy creamers and whipped toppings. Processed cheese, cheese spreads, or cheese  curds.  Condiments  Onion and garlic salt, seasoned salt, table salt, and sea salt. Canned and packaged gravies. Worcestershire sauce. Tartar sauce. Barbecue sauce. Teriyaki sauce. Soy sauce, including reduced sodium. Steak sauce. Fish sauce. Oyster sauce. Cocktail sauce. Horseradish. Ketchup and mustard. Meat flavorings and tenderizers. Bouillon cubes. Hot sauce. Tabasco sauce. Marinades. Taco seasonings. Relishes.  Fats and Oils  Butter, stick margarine, lard, shortening, ghee, and bacon fat. Coconut, palm kernel, or palm oils. Regular salad dressings.  Other  Pickles and olives. Salted popcorn and pretzels.  The items listed above may not be a complete list of foods and beverages to avoid. Contact your dietitian for more information.  WHERE CAN I FIND MORE INFORMATION?  National Heart, Lung, and Blood Institute: www.nhlbi.nih.gov/health/health-topics/topics/dash/     This information is not intended to replace advice given to you by your health care provider. Make sure you discuss any questions you have with your health care provider.     Document Released: 02/24/2011 Document Revised: 03/28/2014 Document Reviewed: 01/09/2013  Elsevier Interactive Patient Education ©2016 Elsevier Inc.

## 2015-02-19 NOTE — Progress Notes (Signed)
   Subjective:    Patient ID: Jose Adkins, male    DOB: 1975/08/02, 39 y.o.   MRN: [id number]  Sinusitis This is a new problem. The current episode started in the past 7 days. There has been no fever. The pain is moderate. Associated symptoms include coughing, headaches, neck pain, sinus pressure and sneezing. Past treatments include oral decongestants. The treatment provided no relief.   Patient states that he has no other concerns at this time.  Patient relates that he is had intermittent headaches over the past few days some days it's not as bad of the days it's moderately severe he describes in the frontal areas temporal areas back of the head he relates nausea with it and throbbing. Does not vomit because of it. Does not wake him up at night. It was present one morning when he awoke.  Review of Systems  Constitutional: Negative for fever and fatigue.  HENT: Positive for sinus pressure and sneezing.   Respiratory: Positive for cough.   Gastrointestinal: Positive for nausea. Negative for diarrhea.  Musculoskeletal: Positive for neck pain.  Neurological: Positive for headaches.       Objective:   Physical Exam  Constitutional: He appears well-developed.  HENT:  Head: Normocephalic.  Mouth/Throat: Oropharynx is clear and moist. No oropharyngeal exudate.  Neck: Normal range of motion.  Cardiovascular: Normal rate, regular rhythm and normal heart sounds.   No murmur heard. Pulmonary/Chest: Effort normal and breath sounds normal. He has no wheezes.  Lymphadenopathy:    He has no cervical adenopathy.  Neurological: He exhibits normal muscle tone.  Skin: Skin is warm and dry.  Nursing note and vitals reviewed.   Neck is supple Blood pressure was checked twice left side 148/108 right side 150/110     Assessment & Plan:  Possible sinusitis antibiotics prescribed warning signs discussed  Headache-I believe this is probably related to elevated blood pressure, there is no  nuchal rigidity. Patient does not appear toxic, I doubt any type of tumor overgrowth  Elevated blood pressure increase amlodipine 5 mg he is to start using 2 daily he is to follow-up next week to recheck blood pressure  If patient starts having high fevers neck stiffness severe vomiting or worse go to ER

## 2015-02-23 ENCOUNTER — Encounter: Payer: Self-pay | Admitting: Family Medicine

## 2015-02-23 ENCOUNTER — Ambulatory Visit (INDEPENDENT_AMBULATORY_CARE_PROVIDER_SITE_OTHER): Payer: BLUE CROSS/BLUE SHIELD | Admitting: Family Medicine

## 2015-02-23 DIAGNOSIS — Z13220 Encounter for screening for lipoid disorders: Secondary | ICD-10-CM | POA: Diagnosis not present

## 2015-02-23 DIAGNOSIS — I10 Essential (primary) hypertension: Secondary | ICD-10-CM

## 2015-02-23 MED ORDER — AMLODIPINE BESYLATE 10 MG PO TABS: 10.0000 mg | ORAL_TABLET | Freq: Every day | ORAL | Status: DC

## 2015-02-23 MED ORDER — LISINOPRIL-HYDROCHLOROTHIAZIDE 10-12.5 MG PO TABS: 1.0000 | ORAL_TABLET | Freq: Every day | ORAL | Status: DC

## 2015-02-23 NOTE — Patient Instructions (Signed)
DASH Eating Plan  DASH stands for "Dietary Approaches to Stop Hypertension." The DASH eating plan is a healthy eating plan that has been shown to reduce high blood pressure (hypertension). Additional health benefits may include reducing the risk of type 2 diabetes mellitus, heart disease, and stroke. The DASH eating plan may also help with weight loss.  WHAT DO I NEED TO KNOW ABOUT THE DASH EATING PLAN?  For the DASH eating plan, you will follow these general guidelines:  · Choose foods with a percent daily value for sodium of less than 5% (as listed on the food label).  · Use salt-free seasonings or herbs instead of table salt or sea salt.  · Check with your health care provider or pharmacist before using salt substitutes.  · Eat lower-sodium products, often labeled as "lower sodium" or "no salt added."  · Eat fresh foods.  · Eat more vegetables, fruits, and low-fat dairy products.  · Choose whole grains. Look for the word "whole" as the first word in the ingredient list.  · Choose fish and skinless chicken or turkey more often than red meat. Limit fish, poultry, and meat to 6 oz (170 g) each day.  · Limit sweets, desserts, sugars, and sugary drinks.  · Choose heart-healthy fats.  · Limit cheese to 1 oz (28 g) per day.  · Eat more home-cooked food and less restaurant, buffet, and fast food.  · Limit fried foods.  · Cook foods using methods other than frying.  · Limit canned vegetables. If you do use them, rinse them well to decrease the sodium.  · When eating at a restaurant, ask that your food be prepared with less salt, or no salt if possible.  WHAT FOODS CAN I EAT?  Seek help from a dietitian for individual calorie needs.  Grains  Whole grain or whole wheat bread. Brown rice. Whole grain or whole wheat pasta. Quinoa, bulgur, and whole grain cereals. Low-sodium cereals. Corn or whole wheat flour tortillas. Whole grain cornbread. Whole grain crackers. Low-sodium crackers.  Vegetables  Fresh or frozen vegetables  (raw, steamed, roasted, or grilled). Low-sodium or reduced-sodium tomato and vegetable juices. Low-sodium or reduced-sodium tomato sauce and paste. Low-sodium or reduced-sodium canned vegetables.   Fruits  All fresh, canned (in natural juice), or frozen fruits.  Meat and Other Protein Products  Ground beef (85% or leaner), grass-fed beef, or beef trimmed of fat. Skinless chicken or turkey. Ground chicken or turkey. Pork trimmed of fat. All fish and seafood. Eggs. Dried beans, peas, or lentils. Unsalted nuts and seeds. Unsalted canned beans.  Dairy  Low-fat dairy products, such as skim or 1% milk, 2% or reduced-fat cheeses, low-fat ricotta or cottage cheese, or plain low-fat yogurt. Low-sodium or reduced-sodium cheeses.  Fats and Oils  Tub margarines without trans fats. Light or reduced-fat mayonnaise and salad dressings (reduced sodium). Avocado. Safflower, olive, or canola oils. Natural peanut or almond butter.  Other  Unsalted popcorn and pretzels.  The items listed above may not be a complete list of recommended foods or beverages. Contact your dietitian for more options.  WHAT FOODS ARE NOT RECOMMENDED?  Grains  White bread. White pasta. White rice. Refined cornbread. Bagels and croissants. Crackers that contain trans fat.  Vegetables  Creamed or fried vegetables. Vegetables in a cheese sauce. Regular canned vegetables. Regular canned tomato sauce and paste. Regular tomato and vegetable juices.  Fruits  Dried fruits. Canned fruit in light or heavy syrup. Fruit juice.  Meat and Other Protein   Products  Fatty cuts of meat. Ribs, chicken wings, bacon, sausage, bologna, salami, chitterlings, fatback, hot dogs, bratwurst, and packaged luncheon meats. Salted nuts and seeds. Canned beans with salt.  Dairy  Whole or 2% milk, cream, half-and-half, and cream cheese. Whole-fat or sweetened yogurt. Full-fat cheeses or blue cheese. Nondairy creamers and whipped toppings. Processed cheese, cheese spreads, or cheese  curds.  Condiments  Onion and garlic salt, seasoned salt, table salt, and sea salt. Canned and packaged gravies. Worcestershire sauce. Tartar sauce. Barbecue sauce. Teriyaki sauce. Soy sauce, including reduced sodium. Steak sauce. Fish sauce. Oyster sauce. Cocktail sauce. Horseradish. Ketchup and mustard. Meat flavorings and tenderizers. Bouillon cubes. Hot sauce. Tabasco sauce. Marinades. Taco seasonings. Relishes.  Fats and Oils  Butter, stick margarine, lard, shortening, ghee, and bacon fat. Coconut, palm kernel, or palm oils. Regular salad dressings.  Other  Pickles and olives. Salted popcorn and pretzels.  The items listed above may not be a complete list of foods and beverages to avoid. Contact your dietitian for more information.  WHERE CAN I FIND MORE INFORMATION?  National Heart, Lung, and Blood Institute: www.nhlbi.nih.gov/health/health-topics/topics/dash/     This information is not intended to replace advice given to you by your health care provider. Make sure you discuss any questions you have with your health care provider.     Document Released: 02/24/2011 Document Revised: 03/28/2014 Document Reviewed: 01/09/2013  Elsevier Interactive Patient Education ©2016 Elsevier Inc.

## 2015-02-23 NOTE — Progress Notes (Signed)
   Subjective:    Patient ID: Jose Adkins, male    DOB: [birth year], 39 y.o.   MRN: [id number]  HPI   this patient was recently seen for hypertension. His had severe headache all last week through the weekend. On Saturday he called and spoke with me. Patient was having significant headache no nausea or vomiting. He had taken his blood pressure at [facility] new was 170/109. Severe 4 we started Zestoretic 10/12.5 one daily in addition to amlodipine 10 mg daily and over the weekend he was instructed to take it easy rest up in hopefully gradually turn corner and get better. Patient denies any chest tightness pressure pain shortness breath nausea vomiting diarrhea has a history of sleep apnea and also history of significant obesity  Review of Systems  patient denies chest tightness pressure pain shortness breath nausea vomiting diarrhea headache much better    Objective:   Physical Exam  blood pressure taken of both left and right arm a proximally 126/84 in both arms. Extremities no edema lungs are clear respiratory rate is normal heart is regular no murmurs pulse normal       Assessment & Plan:   HTN- continue current medications. Follow-up in the springtime. If return of headaches or having any problem with medication to call us. In addition to this use DASH  Diet and also try to lose weight. Follow-up sooner problems. Lab work to be done within the next 2 weeks

## 2015-02-27 ENCOUNTER — Ambulatory Visit: Payer: BLUE CROSS/BLUE SHIELD | Admitting: Family Medicine

## 2015-03-09 ENCOUNTER — Other Ambulatory Visit: Payer: Self-pay | Admitting: Family Medicine

## 2015-04-19 ENCOUNTER — Other Ambulatory Visit: Payer: Self-pay | Admitting: Family Medicine

## 2015-04-29 ENCOUNTER — Other Ambulatory Visit: Payer: Self-pay | Admitting: Family Medicine

## 2015-05-25 ENCOUNTER — Encounter: Payer: Self-pay | Admitting: Family Medicine

## 2015-05-25 ENCOUNTER — Ambulatory Visit (INDEPENDENT_AMBULATORY_CARE_PROVIDER_SITE_OTHER): Payer: BLUE CROSS/BLUE SHIELD | Admitting: Family Medicine

## 2015-05-25 VITALS — BP 114/78 | Temp 98.8°F | Ht 67.0 in | Wt 379.0 lb

## 2015-05-25 DIAGNOSIS — J111 Influenza due to unidentified influenza virus with other respiratory manifestations: Secondary | ICD-10-CM

## 2015-05-25 MED ORDER — NAPROXEN 500 MG PO TABS: ORAL_TABLET | ORAL | Status: DC

## 2015-05-25 MED ORDER — FLUOXETINE HCL 20 MG PO CAPS: ORAL_CAPSULE | ORAL | Status: DC

## 2015-05-25 MED ORDER — ZOLPIDEM TARTRATE 10 MG PO TABS: ORAL_TABLET | ORAL | Status: DC

## 2015-05-25 MED ORDER — OSELTAMIVIR PHOSPHATE 75 MG PO CAPS: 75.0000 mg | ORAL_CAPSULE | Freq: Two times a day (BID) | ORAL | Status: DC

## 2015-05-25 NOTE — Progress Notes (Signed)
   Subjective:    Patient ID: Jose Adkins, male    DOB: 01/31/1976, 40 y.o.   MRN: [id number]  URI  This is a new problem. The current episode started in the past 7 days. The problem has been unchanged. There has been no fever. Associated symptoms include coughing, headaches and a sore throat. Associated symptoms comments: Chills, sneezing, muscle aches, runny nose. He has tried nothing for the symptoms. The treatment provided no relief.   Patient denies high fever does relate body aches chills symptoms present over the past 48 hours   Review of Systems  HENT: Positive for sore throat.   Respiratory: Positive for cough.   Neurological: Positive for headaches.       Objective:   Physical Exam Lungs are clear hearts regular mild sinus tenderness eardrums normal not toxic does look like he feels bad with the flu.       Assessment & Plan:   insomnia-Ambien refills given  Patient states he's not taken any pain medicines currently  Viral syndrome influenza treat as symptomatically may use Tamiflu if prescription covered by insurance  Warning signs regarding the flu were discussed in detail.   Blood pressure good currently. Follow-up within a proximally 6 months lab work at that time.  Tamiflu recommended but patient may not be able to afford this.

## 2015-05-25 NOTE — Patient Instructions (Signed)

## 2015-05-27 ENCOUNTER — Ambulatory Visit: Payer: BLUE CROSS/BLUE SHIELD | Admitting: Family Medicine

## 2015-05-31 ENCOUNTER — Other Ambulatory Visit: Payer: Self-pay | Admitting: Family Medicine

## 2015-06-07 ENCOUNTER — Encounter: Payer: Self-pay | Admitting: Family Medicine

## 2015-06-07 LAB — BASIC METABOLIC PANEL
BUN / CREAT RATIO: 14 (ref 9–20)
BUN: 20 mg/dL (ref 6–24)
CHLORIDE: 101 mmol/L (ref 96–106)
CO2: 23 mmol/L (ref 18–29)
Calcium: 9.3 mg/dL (ref 8.7–10.2)
Creatinine, Ser: 1.38 mg/dL — ABNORMAL HIGH (ref 0.76–1.27)
GFR calc non Af Amer: 63 mL/min/{1.73_m2} (ref >59)
GFR, EST AFRICAN AMERICAN: 73 mL/min/{1.73_m2} (ref >59)
Glucose: 82 mg/dL (ref 65–99)
POTASSIUM: 4.4 mmol/L (ref 3.5–5.2)
Sodium: 140 mmol/L (ref 134–144)

## 2015-06-07 LAB — LIPID PANEL
CHOL/HDL RATIO: 3.6 ratio (ref 0.0–5.0)
Cholesterol, Total: 139 mg/dL (ref 100–199)
HDL: 39 mg/dL — AB (ref >39)
LDL Calculated: 83 mg/dL (ref 0–99)
Triglycerides: 85 mg/dL (ref 0–149)
VLDL Cholesterol Cal: 17 mg/dL (ref 5–40)

## 2015-06-22 DIAGNOSIS — G4733 Obstructive sleep apnea (adult) (pediatric): Secondary | ICD-10-CM | POA: Diagnosis not present

## 2015-07-15 ENCOUNTER — Other Ambulatory Visit: Payer: Self-pay | Admitting: Family Medicine

## 2015-09-07 DIAGNOSIS — G4733 Obstructive sleep apnea (adult) (pediatric): Secondary | ICD-10-CM | POA: Diagnosis not present

## 2015-09-08 ENCOUNTER — Encounter: Payer: Self-pay | Admitting: Family Medicine

## 2015-09-08 ENCOUNTER — Ambulatory Visit (INDEPENDENT_AMBULATORY_CARE_PROVIDER_SITE_OTHER): Payer: BLUE CROSS/BLUE SHIELD | Admitting: Family Medicine

## 2015-09-08 VITALS — BP 118/76 | Temp 99.7°F | Ht 67.0 in | Wt 363.0 lb

## 2015-09-08 DIAGNOSIS — J301 Allergic rhinitis due to pollen: Secondary | ICD-10-CM | POA: Diagnosis not present

## 2015-09-08 DIAGNOSIS — M5432 Sciatica, left side: Secondary | ICD-10-CM

## 2015-09-08 MED ORDER — OXYCODONE-ACETAMINOPHEN 10-325 MG PO TABS: 1.0000 | ORAL_TABLET | ORAL | Status: DC | PRN

## 2015-09-08 MED ORDER — PREDNISONE 20 MG PO TABS: ORAL_TABLET | ORAL | Status: DC

## 2015-09-08 NOTE — Progress Notes (Signed)
   Subjective:    Patient ID: Jose Adkins, male    DOB: [birth year], 40 y.o.   MRN: [id number]  Back Pain This is a new problem. Episode onset: 2 weeks. (Left leg numbness, numbness from left elbow to left hand) Treatments tried: naproxen. The treatment provided no relief.   Patient states onset 2 weeks ago low back pain radiates down the left leg has been progressively worse over the past few days unbearable. He also relates low bit of head congestion although its clear phlegm denies fever chills or sweats. Patient also states he even had a little bit numbness into the hands but no weakness in the hands denies ataxia just relates it's difficult to walk because of the pain in his leg Cough, congestion, sore throat, headache. Started a couple days ago.   Review of Systems  Musculoskeletal: Positive for back pain.   Patient describes numbness describes discomfort describes pain going from the lower back down the left leg.   patient denies any loss of bowel or bladder control. Objective:   Physical Exam  Subjective discomfort in the lower back positive straight leg raise on the left lungs are clear hearts regular no crackles no respiratory distress abdomen is soft patient strength is good bilateral strength in the arms good bilateral The patient can walk on his heels as well as his toes but he has a difficult time walking on the toes on the left side because of the pain patient walked down the hallway is a shortened gait because of the pain     Assessment & Plan:  Patient with significant sciatica down the left leg. Pain medication prescribed caution drowsiness do not you overuse. Patient also to do prednisone taper. Patient also to follow-up in approximately 2-3 weeks for recheck I do not recommend MRI at this point. He is to stay out of work this week. Exercises shown. If unable to go back to work on Monday he is let us know. If progression of symptoms he is to let us know and be rechecked  immediately.  Patient advised to use Claritin as needed

## 2015-09-11 ENCOUNTER — Other Ambulatory Visit: Payer: Self-pay | Admitting: Family Medicine

## 2015-09-14 DIAGNOSIS — S338XXA Sprain of other parts of lumbar spine and pelvis, initial encounter: Secondary | ICD-10-CM | POA: Diagnosis not present

## 2015-09-15 ENCOUNTER — Other Ambulatory Visit: Payer: Self-pay | Admitting: Family Medicine

## 2015-09-15 DIAGNOSIS — S338XXA Sprain of other parts of lumbar spine and pelvis, initial encounter: Secondary | ICD-10-CM | POA: Diagnosis not present

## 2015-09-15 NOTE — Telephone Encounter (Signed)
Ok times 4 

## 2015-09-24 ENCOUNTER — Ambulatory Visit: Payer: BLUE CROSS/BLUE SHIELD | Admitting: Family Medicine

## 2015-11-19 ENCOUNTER — Other Ambulatory Visit: Payer: Self-pay | Admitting: Family Medicine

## 2015-11-19 NOTE — Telephone Encounter (Signed)
Ok six mo 

## 2016-01-11 ENCOUNTER — Other Ambulatory Visit: Payer: Self-pay | Admitting: Family Medicine

## 2016-01-11 DIAGNOSIS — G4733 Obstructive sleep apnea (adult) (pediatric): Secondary | ICD-10-CM | POA: Diagnosis not present

## 2016-01-11 DIAGNOSIS — Z13220 Encounter for screening for lipoid disorders: Secondary | ICD-10-CM

## 2016-01-11 DIAGNOSIS — I10 Essential (primary) hypertension: Secondary | ICD-10-CM

## 2016-02-26 ENCOUNTER — Ambulatory Visit (INDEPENDENT_AMBULATORY_CARE_PROVIDER_SITE_OTHER): Payer: BLUE CROSS/BLUE SHIELD | Admitting: Family Medicine

## 2016-02-26 ENCOUNTER — Encounter: Payer: Self-pay | Admitting: Family Medicine

## 2016-02-26 VITALS — BP 134/78 | Ht 67.0 in | Wt 371.0 lb

## 2016-02-26 DIAGNOSIS — F321 Major depressive disorder, single episode, moderate: Secondary | ICD-10-CM | POA: Diagnosis not present

## 2016-02-26 DIAGNOSIS — Z23 Encounter for immunization: Secondary | ICD-10-CM

## 2016-02-26 DIAGNOSIS — I10 Essential (primary) hypertension: Secondary | ICD-10-CM | POA: Diagnosis not present

## 2016-02-26 MED ORDER — DULOXETINE HCL 30 MG PO CPEP: 30.0000 mg | ORAL_CAPSULE | Freq: Every day | ORAL | 5 refills | Status: DC

## 2016-02-26 MED ORDER — CLONAZEPAM 0.5 MG PO TABS: ORAL_TABLET | ORAL | 0 refills | Status: DC

## 2016-02-26 NOTE — Progress Notes (Signed)
   Subjective:    Patient ID: Jose Adkins, male    DOB: 1975-04-04, 40 y.o.   MRN: [id number]  Hypertension  This is a chronic problem.  Patient taken blood pressure medicine as directed not having a problem. Try to watch his diet try to lose weight finding it difficult Patient feeling very stressed. Finds himself feeling down negative at times not suicidal. Father passed several months ago up in [place] he was unable to be with him much this troubles him he is doing some grief sharing which helps not able to shake it on his own would like to be on medication Pt father passed away recently. Pt has not taken prozac in awhile and wants to discuss going back on something.   Has not needed to take [place] since august.    Review of Systems Denies sweats chills fever nausea vomiting diarrhea chest pain shortness of breath    Objective:   Physical Exam Lungs clear heart regular pulse normal obesity noted       Assessment & Plan:  Depression-start medication warning signs discussed follow-up in 4-6 weeks patient not suicidal. We will go ahead and start medication. Warning signs regarding progressive depression discussed. Follow-up sooner problems otherwise recheck within 4-6 weeks. He will be going to [place] in Christmas time which is a good idea  Blood pressure good control  Patient not suicidal

## 2016-03-09 ENCOUNTER — Other Ambulatory Visit: Payer: Self-pay | Admitting: Family Medicine

## 2016-03-29 ENCOUNTER — Ambulatory Visit: Payer: BLUE CROSS/BLUE SHIELD | Admitting: Family Medicine

## 2016-05-11 ENCOUNTER — Ambulatory Visit (INDEPENDENT_AMBULATORY_CARE_PROVIDER_SITE_OTHER): Payer: BLUE CROSS/BLUE SHIELD | Admitting: Family Medicine

## 2016-05-11 ENCOUNTER — Encounter: Payer: Self-pay | Admitting: Family Medicine

## 2016-05-11 VITALS — BP 130/84 | Temp 99.0°F | Ht 67.0 in | Wt 366.0 lb

## 2016-05-11 DIAGNOSIS — J111 Influenza due to unidentified influenza virus with other respiratory manifestations: Secondary | ICD-10-CM | POA: Diagnosis not present

## 2016-05-11 DIAGNOSIS — J4521 Mild intermittent asthma with (acute) exacerbation: Secondary | ICD-10-CM

## 2016-05-11 MED ORDER — ALBUTEROL SULFATE HFA 108 (90 BASE) MCG/ACT IN AERS: 2.0000 | INHALATION_SPRAY | Freq: Four times a day (QID) | RESPIRATORY_TRACT | 2 refills | Status: DC | PRN

## 2016-05-11 MED ORDER — ALBUTEROL SULFATE (2.5 MG/3ML) 0.083% IN NEBU
2.5000 mg | INHALATION_SOLUTION | Freq: Once | RESPIRATORY_TRACT | Status: AC
  Administered 2016-05-11: 2.5 mg via RESPIRATORY_TRACT

## 2016-05-11 MED ORDER — OSELTAMIVIR PHOSPHATE 75 MG PO CAPS: 75.0000 mg | ORAL_CAPSULE | Freq: Two times a day (BID) | ORAL | 0 refills | Status: DC

## 2016-05-11 NOTE — Progress Notes (Signed)
   Subjective:    Patient ID: Jose Adkins, male    DOB: [birth year], 41 y.o.   MRN: [id number]  Cough  This is a new problem. The current episode started in the past 7 days. Associated symptoms include ear pain, a fever, headaches, nasal congestion, a sore throat and wheezing.   Patient relates over the past 36 hours he has had onset of burning in the throat dry cough intermittent wheezing some shortness of breath now with body aches headaches fever and chills.   Review of Systems  Constitutional: Positive for fever.  HENT: Positive for ear pain and sore throat.   Respiratory: Positive for cough and wheezing.   Neurological: Positive for headaches.       Objective:   Physical Exam  Constitutional: He appears well-developed.  HENT:  Head: Normocephalic.  Mouth/Throat: Oropharynx is clear and moist. No oropharyngeal exudate.  Neck: Normal range of motion.  Cardiovascular: Normal rate, regular rhythm and normal heart sounds.   No murmur heard. Pulmonary/Chest: Effort normal. He has wheezes.  Lymphadenopathy:    He has no cervical adenopathy.  Neurological: He exhibits normal muscle tone.  Skin: Skin is warm and dry.  Nursing note and vitals reviewed.  Patient not respiratory distress but does have scattered wheezing He did improve with nebulizer treatment  O2 sats ration 94%     Assessment & Plan:  Viral syndrome Flu Tamiflu 5 days Warnings regarding secondary illness were given Albuterol every 2-4 hours when necessary for wheezing Work excuse for the next several days Influenza-the patient was diagnosed with influenza. Patient/family educated about the flu and warning signs to watch for. If difficulty breathing, severe neck pain and stiffness, cyanosis, disorientation, or progressive worsening then immediately get rechecked at that ER. If progressive symptoms be certain to be rechecked. Supportive measures such as Tylenol/ibuprofen was discussed. No aspirin use in  children. And influenza home care instruction sheet was given.

## 2016-05-11 NOTE — Patient Instructions (Signed)

## 2016-05-13 ENCOUNTER — Telehealth: Payer: Self-pay | Admitting: Family Medicine

## 2016-05-13 ENCOUNTER — Encounter: Payer: Self-pay | Admitting: Family Medicine

## 2016-05-13 ENCOUNTER — Other Ambulatory Visit: Payer: Self-pay | Admitting: Family Medicine

## 2016-05-13 MED ORDER — PREDNISONE 20 MG PO TABS: ORAL_TABLET | ORAL | 0 refills | Status: DC

## 2016-05-13 MED ORDER — DOXYCYCLINE HYCLATE 100 MG PO CAPS: 100.0000 mg | ORAL_CAPSULE | Freq: Two times a day (BID) | ORAL | 0 refills | Status: DC

## 2016-05-13 NOTE — Telephone Encounter (Signed)
Discussed with pt. Pt verbalized understanding and can come in today at 2:30.

## 2016-05-13 NOTE — Telephone Encounter (Signed)
Please talk with the patient see if he can drop by around 21 need/2:30 I would be happy to listen to his lungs as a courtesy

## 2016-05-13 NOTE — Telephone Encounter (Signed)
I saw the patient today as part of a courtesy visit. He was having coughing some wheezing he felt short of breath he was not tachypnea. His O2 sats ration was 98% his temperature was 98 he did have bilateral wheezing with some bronchial coughing but no crackles or rails. He was placed on doxycycline twice a day he was instructed to finish up Flomax and instructed to do a short course of prednisone if he gets significantly worse over the next several days he ought to go to the ER he is to follow-up with [place] if ongoing troubles work excuse for the next several days through February 28-patient educated regarding warning signs

## 2016-05-13 NOTE — Telephone Encounter (Signed)
Patient was diagnosed with the flu on 05/11/16 by Dr. Nicki Reaper.  He was told to call today and give an update on how he is doing.  He said yesterday he felt like he was getting worse, but today he thinks he is feeling better.  He said his chest is concerning him because he is walking a short period and having to use his albuterol.  He said he slept all night last night and didn't wake up sore like he had been.

## 2016-05-16 ENCOUNTER — Encounter: Payer: Self-pay | Admitting: Family Medicine

## 2016-05-16 ENCOUNTER — Other Ambulatory Visit: Payer: Self-pay | Admitting: Family Medicine

## 2016-05-16 ENCOUNTER — Ambulatory Visit (INDEPENDENT_AMBULATORY_CARE_PROVIDER_SITE_OTHER): Payer: BLUE CROSS/BLUE SHIELD | Admitting: Family Medicine

## 2016-05-16 ENCOUNTER — Ambulatory Visit (HOSPITAL_COMMUNITY)
Admission: RE | Admit: 2016-05-16 | Discharge: 2016-05-16 | Disposition: A | Discharge status: Home / Self Care | Payer: BLUE CROSS/BLUE SHIELD | Source: Ambulatory Visit | Attending: Family Medicine | Admitting: Family Medicine

## 2016-05-16 VITALS — BP 112/76 | Temp 98.3°F | Ht 67.0 in | Wt 358.5 lb

## 2016-05-16 DIAGNOSIS — J4521 Mild intermittent asthma with (acute) exacerbation: Secondary | ICD-10-CM | POA: Diagnosis not present

## 2016-05-16 DIAGNOSIS — R05 Cough: Secondary | ICD-10-CM | POA: Diagnosis not present

## 2016-05-16 DIAGNOSIS — J111 Influenza due to unidentified influenza virus with other respiratory manifestations: Secondary | ICD-10-CM

## 2016-05-16 DIAGNOSIS — R059 Cough, unspecified: Secondary | ICD-10-CM

## 2016-05-16 DIAGNOSIS — J9811 Atelectasis: Secondary | ICD-10-CM | POA: Insufficient documentation

## 2016-05-16 MED ORDER — METHYLPREDNISOLONE ACETATE 40 MG/ML IJ SUSP
40.0000 mg | Freq: Once | INTRAMUSCULAR | Status: AC
  Administered 2016-05-16: 40 mg via INTRAMUSCULAR

## 2016-05-16 MED ORDER — PREDNISONE 20 MG PO TABS: ORAL_TABLET | ORAL | 0 refills | Status: DC

## 2016-05-16 NOTE — Progress Notes (Signed)
   Subjective:    Patient ID: Jose Adkins, male    DOB: [birth year], 41 y.o.   MRN: [id number]  Influenza  This is a new problem. The current episode started in the past 7 days. Associated symptoms include chest pain, congestion, fatigue and myalgias. Treatments tried: Tamiflu, Prednisone, Doxycycline    Patient has cough congestion wheeziness. Using the albuterol fairly regularly. Diagnosed with flu last week. Given prednisone. Also on doxycycline. Family concern about potential for pneumonia 99%  Patient has concerns of pneumonia.   Review of Systems  Constitutional: Positive for fatigue.  HENT: Positive for congestion.   Cardiovascular: Positive for chest pain.  Musculoskeletal: Positive for myalgias.       Objective:   Physical Exam Alert moderate malaise H&T mom his congestion lungs impressive bilateral wheezes heart rare rhythm       Assessment & Plan:  Impression flu with subsequent flare of reactive airways. On further history patient gets is fairly regularly with respiratory infections. Likely time to call it asthma. At one point needs discussion with Dr. Nicki Reaper regarding asthma and chronic recommendations plan Solu-Medrol injection. Stretch out the prednisone taper. X-ray chest. Addendum x-ray return for atelectasis no pneumonia to maintain docs he

## 2016-08-19 ENCOUNTER — Encounter (HOSPITAL_COMMUNITY): Payer: Self-pay

## 2016-08-19 ENCOUNTER — Emergency Department (HOSPITAL_COMMUNITY): Payer: 59

## 2016-08-19 ENCOUNTER — Emergency Department (HOSPITAL_COMMUNITY)
Admission: EM | Admit: 2016-08-19 | Discharge: 2016-08-19 | Disposition: A | Discharge status: Home / Self Care | Payer: 59 | Attending: Emergency Medicine | Admitting: Emergency Medicine

## 2016-08-19 DIAGNOSIS — R1032 Left lower quadrant pain: Secondary | ICD-10-CM | POA: Diagnosis present

## 2016-08-19 DIAGNOSIS — N200 Calculus of kidney: Secondary | ICD-10-CM

## 2016-08-19 DIAGNOSIS — N10 Acute tubulo-interstitial nephritis: Secondary | ICD-10-CM

## 2016-08-19 DIAGNOSIS — Q631 Lobulated, fused and horseshoe kidney: Secondary | ICD-10-CM | POA: Diagnosis not present

## 2016-08-19 DIAGNOSIS — N12 Tubulo-interstitial nephritis, not specified as acute or chronic: Secondary | ICD-10-CM | POA: Insufficient documentation

## 2016-08-19 DIAGNOSIS — Z79899 Other long term (current) drug therapy: Secondary | ICD-10-CM | POA: Insufficient documentation

## 2016-08-19 DIAGNOSIS — I10 Essential (primary) hypertension: Secondary | ICD-10-CM | POA: Insufficient documentation

## 2016-08-19 LAB — CBC WITH DIFFERENTIAL/PLATELET
Basophils Absolute: 0 10*3/uL (ref 0.0–0.1)
Basophils Relative: 1 %
EOS PCT: 2 %
Eosinophils Absolute: 0.1 10*3/uL (ref 0.0–0.7)
HEMATOCRIT: 46.3 % (ref 39.0–52.0)
Hemoglobin: 14.7 g/dL (ref 13.0–17.0)
LYMPHS ABS: 2.2 10*3/uL (ref 0.7–4.0)
LYMPHS PCT: 37 %
MCH: 28.1 pg (ref 26.0–34.0)
MCHC: 31.7 g/dL (ref 30.0–36.0)
MCV: 88.5 fL (ref 78.0–100.0)
MONO ABS: 0.4 10*3/uL (ref 0.1–1.0)
MONOS PCT: 6 %
Neutro Abs: 3.4 10*3/uL (ref 1.7–7.7)
Neutrophils Relative %: 54 %
Platelets: 165 10*3/uL (ref 150–400)
RBC: 5.23 MIL/uL (ref 4.22–5.81)
RDW: 13.6 % (ref 11.5–15.5)
WBC: 6.1 10*3/uL (ref 4.0–10.5)

## 2016-08-19 LAB — URINALYSIS, ROUTINE W REFLEX MICROSCOPIC
BACTERIA UA: NONE SEEN
Bilirubin Urine: NEGATIVE
GLUCOSE, UA: NEGATIVE mg/dL
KETONES UR: NEGATIVE mg/dL
Nitrite: NEGATIVE
PROTEIN: 100 mg/dL — AB
Specific Gravity, Urine: 1.028 (ref 1.005–1.030)
pH: 5 (ref 5.0–8.0)

## 2016-08-19 LAB — COMPREHENSIVE METABOLIC PANEL
ALBUMIN: 3.9 g/dL (ref 3.5–5.0)
ALT: 30 U/L (ref 17–63)
AST: 24 U/L (ref 15–41)
Alkaline Phosphatase: 65 U/L (ref 38–126)
Anion gap: 8 (ref 5–15)
BILIRUBIN TOTAL: 0.8 mg/dL (ref 0.3–1.2)
BUN: 21 mg/dL — AB (ref 6–20)
CHLORIDE: 103 mmol/L (ref 101–111)
CO2: 28 mmol/L (ref 22–32)
Calcium: 8.7 mg/dL — ABNORMAL LOW (ref 8.9–10.3)
Creatinine, Ser: 1.5 mg/dL — ABNORMAL HIGH (ref 0.61–1.24)
GFR calc Af Amer: >60 mL/min (ref >60)
GFR calc non Af Amer: 56 mL/min — ABNORMAL LOW (ref >60)
GLUCOSE: 95 mg/dL (ref 65–99)
Potassium: 4.2 mmol/L (ref 3.5–5.1)
SODIUM: 139 mmol/L (ref 135–145)
TOTAL PROTEIN: 6.8 g/dL (ref 6.5–8.1)

## 2016-08-19 MED ORDER — OXYCODONE-ACETAMINOPHEN 5-325 MG PO TABS: 1.0000 | ORAL_TABLET | Freq: Four times a day (QID) | ORAL | 0 refills | Status: DC | PRN

## 2016-08-19 MED ORDER — CIPROFLOXACIN HCL 250 MG PO TABS
500.0000 mg | ORAL_TABLET | Freq: Once | ORAL | Status: AC
  Administered 2016-08-19: 500 mg via ORAL
  Filled 2016-08-19: qty 2

## 2016-08-19 MED ORDER — ONDANSETRON HCL 4 MG PO TABS: 4.0000 mg | ORAL_TABLET | Freq: Three times a day (TID) | ORAL | 0 refills | Status: DC | PRN

## 2016-08-19 MED ORDER — DEXTROSE 5 % IV SOLN
1.0000 g | Freq: Once | INTRAVENOUS | Status: AC
  Administered 2016-08-19: 1 g via INTRAVENOUS
  Filled 2016-08-19: qty 10

## 2016-08-19 MED ORDER — OXYCODONE-ACETAMINOPHEN 5-325 MG PO TABS
1.0000 | ORAL_TABLET | Freq: Once | ORAL | Status: AC
  Administered 2016-08-19: 1 via ORAL
  Filled 2016-08-19: qty 1

## 2016-08-19 MED ORDER — ONDANSETRON HCL 4 MG/2ML IJ SOLN
4.0000 mg | Freq: Once | INTRAMUSCULAR | Status: AC
  Administered 2016-08-19: 4 mg via INTRAVENOUS
  Filled 2016-08-19: qty 2

## 2016-08-19 MED ORDER — CIPROFLOXACIN HCL 500 MG PO TABS: 500.0000 mg | ORAL_TABLET | Freq: Two times a day (BID) | ORAL | 0 refills | Status: DC

## 2016-08-19 MED ORDER — SODIUM CHLORIDE 0.9 % IV SOLN
INTRAVENOUS | Status: DC
  Administered 2016-08-19: 03:00:00 via INTRAVENOUS

## 2016-08-19 MED ORDER — KETOROLAC TROMETHAMINE 30 MG/ML IJ SOLN
30.0000 mg | Freq: Once | INTRAMUSCULAR | Status: AC
  Administered 2016-08-19: 30 mg via INTRAVENOUS
  Filled 2016-08-19: qty 1

## 2016-08-19 NOTE — ED Provider Notes (Addendum)
[facility] DEPT Provider Note   CSN: [id number] Arrival date & time: 08/19/16  0200  Time seen 02:37 AM   History   Chief Complaint Chief Complaint  Patient presents with  . Flank Pain    HPI Jose Adkins is a 41 y.o. male.  HPI  patient states about 1:15 this morning he started having pain in his left lower quadrant that radiates into his left back. He has had nausea without vomiting. He denies hematuria. He states laying flat makes the pain worse, standing up makes it feel a bit better. He states he feels like this is another kidney stone although he has not had one in several years. He states he normally passes his kidney stones on his own in a couple of days. He denies any fever. He denies a lot of milk intake but states he drinks about 32 ounces of caffeinated drinks such as tea or Pepsi a day.  PCP Kathyrn Drown, MD [facility]  Past Medical History:  Diagnosis Date  . Anxiety   . Depression   . Kidney stone   . Kidney stones   . Low testosterone   . Reflux   . Sleep apnea     Patient Active Problem List   Diagnosis Date Noted  . HTN (hypertension) 02/23/2015  . Lumbar pain 11/06/2013  . Plantar fasciitis of right foot 05/13/2013  . Family history of melanoma 05/13/2013  . Kidney stones 10/18/2012  . Obstructive sleep apnea 10/18/2012  . Insomnia 10/18/2012  . Generalized anxiety disorder 10/18/2012  . Morbid obesity ([place]) 10/18/2012    Past Surgical History:  Procedure Laterality Date  . VASECTOMY  2002       Home Medications    Prior to Admission medications   Medication Sig Start Date End Date Taking? Authorizing Provider  albuterol (PROVENTIL HFA;VENTOLIN HFA) 108 (90 Base) MCG/ACT inhaler Inhale 2 puffs into the lungs every 6 (six) hours as needed for wheezing. 05/11/16   Kathyrn Drown, MD  amLODipine (NORVASC) 10 MG tablet TAKE ONE TABLET BY MOUTH ONCE DAILY 01/11/16   Kathyrn Drown, MD  chlorzoxazone (PARAFON) 500 MG tablet  TAKE ONE TABLET BY MOUTH THREE TIMES DAILY AS NEEDED FOR MUSCLE SPASM --  **DO  NOT  TAKE  WITH  PAIN  MEDICATION** Patient not taking: Reported on 05/16/2016 09/13/15   Kathyrn Drown, MD  chlorzoxazone (PARAFON) 500 MG tablet TAKE ONE TABLET BY MOUTH THREE TIMES DAILY AS NEEDED FOR MUSCLE SPASM --  **DO  NOT  TAKE  WITH  PAIN  MEDICATION** Patient not taking: Reported on 05/16/2016 09/15/15   Kathyrn Drown, MD  ciprofloxacin (CIPRO) 500 MG tablet Take 1 tablet (500 mg total) by mouth 2 (two) times daily. 08/19/16   Rolland Porter, MD  clonazePAM (KLONOPIN) 0.5 MG tablet 1/2 to 1 bid prn anxiety use sparingly Patient not taking: Reported on 05/16/2016 02/26/16   Kathyrn Drown, MD  doxycycline (VIBRAMYCIN) 100 MG capsule Take 1 capsule (100 mg total) by mouth 2 (two) times daily. 05/13/16   Kathyrn Drown, MD  DULoxetine (CYMBALTA) 30 MG capsule Take 1 capsule (30 mg total) by mouth daily. 02/26/16   Kathyrn Drown, MD  lisinopril-hydrochlorothiazide (PRINZIDE,ZESTORETIC) 10-12.5 MG tablet Take 1 tablet by mouth daily. 02/23/15   Kathyrn Drown, MD  naproxen (NAPROSYN) 500 MG tablet TAKE ONE TABLET BY MOUTH TWICE DAILY WITH  MEALS 05/25/15   Luking, Elayne Snare, MD  ondansetron (ZOFRAN) 4 MG  tablet Take 1 tablet (4 mg total) by mouth every 8 (eight) hours as needed for nausea or vomiting. 08/19/16   Rolland Porter, MD  oseltamivir (TAMIFLU) 75 MG capsule Take 1 capsule (75 mg total) by mouth 2 (two) times daily. Patient not taking: Reported on 05/16/2016 05/11/16   Kathyrn Drown, MD  oxyCODONE-acetaminophen (PERCOCET/ROXICET) 5-325 MG tablet Take 1 tablet by mouth every 6 (six) hours as needed for severe pain. 08/19/16   Rolland Porter, MD  predniSONE (DELTASONE) 20 MG tablet 3qd for 2d then 2qd for 2d then 1qd for 2d 05/13/16   Kathyrn Drown, MD  predniSONE (DELTASONE) 20 MG tablet Take 3 tablets by mouth for 3 days, then take 2 tablets by mouth for 3 days, then take 1 tablet by mouth for 2 days 05/16/16   Mikey Kirschner, MD  zolpidem (AMBIEN) 10 MG tablet TAKE ONE TABLET BY MOUTH AT BEDTIME AS NEEDED *DO  NOT  TAKE  WITH  PAIN  MEDICATION* Patient not taking: Reported on 02/26/2016 11/20/15   Mikey Kirschner, MD    Family History No family history on file.  Social History Social History  Substance Use Topics  . Smoking status: Never Smoker  . Smokeless tobacco: Never Used  . Alcohol use No  employed   Allergies   Patient has no known allergies.   Review of Systems Review of Systems  All other systems reviewed and are negative.    Physical Exam Updated Vital Signs BP 113/77   Pulse 62   Temp 98 F (36.7 C) (Oral)   Resp (!) 22   Ht 5\' 7"  (1.702 m)   Wt (!) 163.3 kg (360 lb)   SpO2 98%   BMI 56.38 kg/m   Vital signs normal    Physical Exam  Constitutional: He is oriented to person, place, and time. He appears well-developed and well-nourished.  Non-toxic appearance. He does not appear ill. No distress.  Patient lying quietly on a stretcher in no apparent distress. His skin color is normal.  HENT:  Head: Normocephalic and atraumatic.  Right Ear: External ear normal.  Left Ear: External ear normal.  Nose: Nose normal. No mucosal edema or rhinorrhea.  Mouth/Throat: Oropharynx is clear and moist and mucous membranes are normal. No dental abscesses or uvula swelling.  Eyes: Conjunctivae and EOM are normal. Pupils are equal, round, and reactive to light.  Neck: Normal range of motion and full passive range of motion without pain. Neck supple.  Cardiovascular: Normal rate, regular rhythm and normal heart sounds.  Exam reveals no gallop and no friction rub.   No murmur heard. Pulmonary/Chest: Effort normal and breath sounds normal. No respiratory distress. He has no wheezes. He has no rhonchi. He has no rales. He exhibits no tenderness and no crepitus.  Abdominal: Soft. Normal appearance and bowel sounds are normal. He exhibits no distension. There is tenderness in the left lower  quadrant. There is no rebound and no guarding.    Genitourinary:  Genitourinary Comments: Patient has left CVA tenderness, patient also describes some discomfort when he flexes his waist to the left.  Musculoskeletal: Normal range of motion. He exhibits no edema or tenderness.  Moves all extremities well.   Neurological: He is alert and oriented to person, place, and time. He has normal strength. No cranial nerve deficit.  Skin: Skin is warm, dry and intact. No rash noted. No erythema. No pallor.  Psychiatric: He has a normal mood and affect. His speech  is normal and behavior is normal. His mood appears not anxious.  Nursing note and vitals reviewed.    ED Treatments / Results  Labs (all labs ordered are listed, but only abnormal results are displayed) Results for orders placed or performed during the hospital encounter of 08/19/16  Urinalysis, Routine w reflex microscopic  Result Value Ref Range   Color, Urine YELLOW YELLOW   APPearance HAZY (A) CLEAR   Specific Gravity, Urine 1.028 1.005 - 1.030   pH 5.0 5.0 - 8.0   Glucose, UA NEGATIVE NEGATIVE mg/dL   Hgb urine dipstick LARGE (A) NEGATIVE   Bilirubin Urine NEGATIVE NEGATIVE   Ketones, ur NEGATIVE NEGATIVE mg/dL   Protein, ur 100 (A) NEGATIVE mg/dL   Nitrite NEGATIVE NEGATIVE   Leukocytes, UA SMALL (A) NEGATIVE   RBC / HPF TOO NUMEROUS TO COUNT 0 - 5 RBC/hpf   WBC, UA TOO NUMEROUS TO COUNT 0 - 5 WBC/hpf   Bacteria, UA NONE SEEN NONE SEEN   Squamous Epithelial / LPF 0-5 (A) NONE SEEN   Mucous PRESENT   Comprehensive metabolic panel  Result Value Ref Range   Sodium 139 135 - 145 mmol/L   Potassium 4.2 3.5 - 5.1 mmol/L   Chloride 103 101 - 111 mmol/L   CO2 28 22 - 32 mmol/L   Glucose, Bld 95 65 - 99 mg/dL   BUN 21 (H) 6 - 20 mg/dL   Creatinine, Ser 1.50 (H) 0.61 - 1.24 mg/dL   Calcium 8.7 (L) 8.9 - 10.3 mg/dL   Total Protein 6.8 6.5 - 8.1 g/dL   Albumin 3.9 3.5 - 5.0 g/dL   AST 24 15 - 41 U/L   ALT 30 17 - 63 U/L    Alkaline Phosphatase 65 38 - 126 U/L   Total Bilirubin 0.8 0.3 - 1.2 mg/dL   GFR calc non Af Amer 56 (L) >60 mL/min   GFR calc Af Amer >60 >60 mL/min   Anion gap 8 5 - 15  CBC with Differential  Result Value Ref Range   WBC 6.1 4.0 - 10.5 K/uL   RBC 5.23 4.22 - 5.81 MIL/uL   Hemoglobin 14.7 13.0 - 17.0 g/dL   HCT 46.3 39.0 - 52.0 %   MCV 88.5 78.0 - 100.0 fL   MCH 28.1 26.0 - 34.0 pg   MCHC 31.7 30.0 - 36.0 g/dL   RDW 13.6 11.5 - 15.5 %   Platelets 165 150 - 400 K/uL   Neutrophils Relative % 54 %   Neutro Abs 3.4 1.7 - 7.7 K/uL   Lymphocytes Relative 37 %   Lymphs Abs 2.2 0.7 - 4.0 K/uL   Monocytes Relative 6 %   Monocytes Absolute 0.4 0.1 - 1.0 K/uL   Eosinophils Relative 2 %   Eosinophils Absolute 0.1 0.0 - 0.7 K/uL   Basophils Relative 1 %   Basophils Absolute 0.0 0.0 - 0.1 K/uL   Laboratory interpretation all normal except possible UTI, mild increase in his renal insufficiency    EKG  EKG Interpretation None       Radiology Ct Renal Stone Study  Result Date: 08/19/2016 CLINICAL DATA:  Acute onset of left flank pain and left lower quadrant abdominal pain. Initial encounter. EXAM: CT ABDOMEN AND PELVIS WITHOUT CONTRAST TECHNIQUE: Multidetector CT imaging of the abdomen and pelvis was performed following the standard protocol without IV contrast. COMPARISON:  CT of the abdomen and pelvis performed 10/29/2014 FINDINGS: Lower chest: The visualized lung bases are grossly clear. The  visualized portions of the mediastinum are unremarkable. Hepatobiliary: There is diffuse fatty inflation within the liver. The liver is otherwise unremarkable. The gallbladder is unremarkable in appearance. The common bile duct remains normal in caliber. Pancreas: The pancreas is within normal limits. Spleen: The spleen is unremarkable in appearance. Adrenals/Urinary Tract: The adrenal glands are unremarkable in appearance. A horseshoe kidney is noted. There is soft tissue inflammation about the left  side of the horseshoe kidney, with a 1.3 x 1.1 cm stone at the left renal pelvis. Mild associated hydronephrosis is noted on the left side. The degree of soft tissue inflammation is concerning for underlying left-sided pyelonephritis. Stomach/Bowel: The stomach is unremarkable in appearance. The small bowel is within normal limits. The appendix is normal in caliber, without evidence of appendicitis. The colon is unremarkable in appearance. Vascular/Lymphatic: The abdominal aorta is unremarkable in appearance. The inferior vena cava is grossly unremarkable. No retroperitoneal lymphadenopathy is seen. No pelvic sidewall lymphadenopathy is identified. Reproductive: The bladder is decompressed and not well assessed. A small urachal remnant is noted. The prostate remains normal in size. Other: No additional soft tissue abnormalities are seen. Musculoskeletal: No acute osseous abnormalities are identified. Facet disease is noted at the lower lumbar spine. The visualized musculature is unremarkable in appearance. IMPRESSION: 1. Soft tissue inflammation about the left side of the patient's horseshoe kidney. 1.3 x 1.1 cm stone at the left renal pelvis, with mild associated left-sided hydronephrosis, concerning for intermittent obstruction. Degree of soft tissue inflammation raises concern for underlying left-sided pyelonephritis. 2. Diffuse fatty infiltration within the liver. Electronically Signed   By: Garald Balding M.D.   On: 08/19/2016 03:19    Procedures Procedures (including critical care time)  Medications Ordered in ED Medications  0.9 %  sodium chloride infusion ( Intravenous New Bag/Given 08/19/16 0257)  cefTRIAXone (ROCEPHIN) 1 g in dextrose 5 % 50 mL IVPB (1 g Intravenous New Bag/Given 08/19/16 0643)  oxyCODONE-acetaminophen (PERCOCET/ROXICET) 5-325 MG per tablet 1 tablet (not administered)  ketorolac (TORADOL) 30 MG/ML injection 30 mg (30 mg Intravenous Given 08/19/16 0256)  ondansetron (ZOFRAN) injection  4 mg (4 mg Intravenous Given 08/19/16 0256)  ciprofloxacin (CIPRO) tablet 500 mg (500 mg Oral Given 08/19/16 0423)     Initial Impression / Assessment and Plan / ED Course  I have reviewed the triage vital signs and the nursing notes.  Pertinent labs & imaging results that were available during my care of the patient were reviewed by me and considered in my medical decision making (see chart for details).  Patient was started on IV fluids and given IV Toradol and Zofran for his pain and nausea. CT scan renal was done.  4:10 AM after reviewing his urinalysis results urine culture was ordered and patient was started on IV Cipro.  Recheck at 4:20 AM, patient states his pain is much improved. We discussed his urinalysis and CT result. I added blood work and will talk to the urologist. Patient may need admission.  6:33 AM spoke to urologist, Dr. Matilde Sprang, states to give him Rocephin IV and send him home on Cipro twice a day for 10 days, he can follow-up in the office in [place]. He states however if he should get fever he should he rechecked immediately.  I discussed treatment plan with the patient. He understands if he gets a fever or has uncontrolled vomiting he should return to the emergency department, otherwise he can follow-up with a Lyme serology. He should take the antibiotics until gone. He states  his pain is starting to return, he was given Percocet by mouth.  Review of the [place] shows patient only has one prescription in the past 6 months ago controlled substance, #16 clonazepam 0.5 mg prescribed on 02/26/2016 by his PCP.  Final Clinical Impressions(s) / ED Diagnoses   Final diagnoses:  Pyelonephritis, acute  Kidney stone on left side  Horseshoe kidney with renal calculus    New Prescriptions New Prescriptions   CIPROFLOXACIN (CIPRO) 500 MG TABLET    Take 1 tablet (500 mg total) by mouth 2 (two) times daily.   ONDANSETRON (ZOFRAN) 4 MG TABLET    Take 1 tablet  (4 mg total) by mouth every 8 (eight) hours as needed for nausea or vomiting.   OXYCODONE-ACETAMINOPHEN (PERCOCET/ROXICET) 5-325 MG TABLET    Take 1 tablet by mouth every 6 (six) hours as needed for severe pain.    Plan discharge  Rolland Porter, MD, Barbette Or, MD 08/19/16 [place], [place], [place] 08/19/16 [id number]

## 2016-08-19 NOTE — ED Triage Notes (Signed)
Left flank pain onset 40 minutes ago.  Kidney stones in the past multiple times.

## 2016-08-19 NOTE — Discharge Instructions (Signed)
Drink plenty of fluids. Take the antibiotics until gone, uses Zofran as needed for nausea or vomiting, take the Percocet as needed for severe pain. Please call [facility] to get a follow-up appointment. However you should return to the ED if you get a fever or have uncontrolled vomiting.

## 2016-08-19 NOTE — ED Notes (Signed)
Pt made aware to return if symptoms worsen or if any life threatening symptoms occur.   

## 2016-08-20 LAB — URINE CULTURE

## 2016-08-24 ENCOUNTER — Ambulatory Visit (INDEPENDENT_AMBULATORY_CARE_PROVIDER_SITE_OTHER): Payer: 59 | Admitting: Family Medicine

## 2016-08-24 ENCOUNTER — Encounter: Payer: Self-pay | Admitting: Family Medicine

## 2016-08-24 VITALS — BP 158/94 | Temp 98.6°F | Ht 67.0 in | Wt 378.0 lb

## 2016-08-24 DIAGNOSIS — N10 Acute tubulo-interstitial nephritis: Secondary | ICD-10-CM | POA: Diagnosis not present

## 2016-08-24 DIAGNOSIS — Z13220 Encounter for screening for lipoid disorders: Secondary | ICD-10-CM | POA: Diagnosis not present

## 2016-08-24 DIAGNOSIS — N200 Calculus of kidney: Secondary | ICD-10-CM

## 2016-08-24 DIAGNOSIS — I10 Essential (primary) hypertension: Secondary | ICD-10-CM

## 2016-08-24 MED ORDER — LISINOPRIL-HYDROCHLOROTHIAZIDE 20-12.5 MG PO TABS: 1.0000 | ORAL_TABLET | Freq: Every day | ORAL | 5 refills | Status: DC

## 2016-08-24 MED ORDER — ONDANSETRON 4 MG PO TBDP: 4.0000 mg | ORAL_TABLET | Freq: Four times a day (QID) | ORAL | 2 refills | Status: DC | PRN

## 2016-08-24 NOTE — Progress Notes (Signed)
   Subjective:    Patient ID: Jose Adkins, male    DOB: [birth year], 41 y.o.   MRN: [id number]  Abdominal Pain  This is a new problem. Associated symptoms comments: Painful urination.  Went to ED and [facility] for kidney stone.   Headache and elevated bp at urologist.   Fever, nausea. Started today.   Pt has had elev blood presure, 170 over 100. Now having headache sith it, affecting how he is feeling. Diminished energy   Blood pressure medicine and blood pressure levels reviewed today with patient. Compliant with blood pressure medicine. States does not miss a dose. No obvious side effects. Blood pressure generally good when checked elsewhere. Watching salt intake.  Claims compliance with bp meds  Complete hospital record reviewed. Was seen had a very large stone. Obstructive pattern. Next  Patient very frustrated he went to the urologist weighted for 3 hours. Then was told he would not be able to see a clinician. Became quite frustrated I told patient I would be very frustrated with this scenario also.  Urine culture unhelpful.  Review scan shows a did reveal element of probable hilum nephritis which is adding to the flank pain and systemic symptoms next  Has been on same dose of blood pressure meds for quite some  Review of Systems  Gastrointestinal: Positive for abdominal pain.       Objective:   Physical Exam Alert active mild malaise blood pressure repeat 1 [id number] positive less CVA tenderness. Lungs clear. Heart regular in rhythm.       Assessment & Plan:  Impression 1 hypertension suboptimal discussed will add lisinopril to 20/12.5 #2 left renal hilum nephritis complicated by stone. Patient is waiting for 4 call back from urologist. I told him given a day or 2 and then call back if they don't return the call. Maintain symptom meantime. Add Zofran for symptom care  Very long discussion held  Greater than 50% of this 25 minute face to face visit was spent in  counseling and discussion and coordination of care regarding the above diagnosis/diagnosies

## 2016-08-26 ENCOUNTER — Other Ambulatory Visit: Payer: Self-pay | Admitting: Urology

## 2016-09-01 ENCOUNTER — Other Ambulatory Visit: Payer: Self-pay | Admitting: Family Medicine

## 2016-09-01 DIAGNOSIS — I10 Essential (primary) hypertension: Secondary | ICD-10-CM

## 2016-09-01 DIAGNOSIS — Z13220 Encounter for screening for lipoid disorders: Secondary | ICD-10-CM

## 2016-09-02 NOTE — Patient Instructions (Addendum)
Jose Adkins  09/02/2016   Your procedure is scheduled on: 09-09-16  Report to [facility] Main  Entrance Take [place]  E[facility] to 3rd floor to  [place] at 2:45 PM.   Call this number if you have problems the morning of surgery [phone]    Remember: ONLY 1 PERSON MAY GO WITH YOU TO SHORT STAY TO GET  READY MORNING OF [place].  Do not eat food or drink liquids :After Midnight. You may have a Clear Liquid Diet until 10:45. After 10:45, nothing by mouth     CLEAR LIQUID DIET   Foods Allowed                                                                     Foods Excluded  Coffee and tea, regular and decaf                             liquids that you cannot  Plain Jell-O in any flavor                                             see through such as: Fruit ices (not with fruit pulp)                                     milk, soups, orange juice  Iced Popsicles                                    All solid food Carbonated beverages, regular and diet                                    Cranberry, grape and apple juices Sports drinks like Gatorade Lightly seasoned clear broth or consume(fat free) Sugar, honey syrup  Sample Menu Breakfast                                Lunch                                     Supper Cranberry juice                    Beef broth                            Chicken broth Jell-O                                     Grape juice  Apple juice Coffee or tea                        Jell-O                                      Popsicle                                                Coffee or tea                        Coffee or tea  _____________________________________________________________________     Take these medicines the morning of surgery with A SIP OF WATER: Amlodipine (Norvasc), and Duloxetine (Cymbalta)                               You may not have any metal on your body including  hair pins and              piercings.  Men may shave face and neck.   Do not bring valuables to the hospital. [place].  Contacts, dentures or bridgework may not be worn into surgery.     Patients discharged the day of surgery will not be allowed to drive home.  Name and phone number of your driver: Anderson Malta [phone]  Special Instructions: Please bring your CPAP mask and tubing              Please read over the following fact sheets you were given: _____________________________________________________________________             [facility] - Preparing for Surgery Before surgery, you can play an important role.  Because skin is not sterile, your skin needs to be as free of germs as possible.  You can reduce the number of germs on your skin by washing with CHG (chlorahexidine gluconate) soap before surgery.  CHG is an antiseptic cleaner which kills germs and bonds with the skin to continue killing germs even after washing. Please DO NOT use if you have an allergy to CHG or antibacterial soaps.  If your skin becomes reddened/irritated stop using the CHG and inform your nurse when you arrive at Short Stay. Do not shave (including legs and underarms) for at least 48 hours prior to the first CHG shower.  You may shave your face/neck. Please follow these instructions carefully:  1.  Shower with CHG Soap the night before surgery and the  morning of Surgery.  2.  If you choose to wash your hair, wash your hair first as usual with your  normal  shampoo.  3.  After you shampoo, rinse your hair and body thoroughly to remove the  shampoo.                           4.  Use CHG as you would any other liquid soap.  You can apply chg directly  to the skin and wash  Gently with a scrungie or clean washcloth.  5.  Apply the CHG Soap to your body ONLY FROM THE NECK DOWN.   Do not use on face/ open                           Wound or  open sores. Avoid contact with eyes, ears mouth and genitals (private parts).                       Wash face,  Genitals (private parts) with your normal soap.             6.  Wash thoroughly, paying special attention to the area where your surgery  will be performed.  7.  Thoroughly rinse your body with warm water from the neck down.  8.  DO NOT shower/wash with your normal soap after using and rinsing off  the CHG Soap.                9.  Pat yourself dry with a clean towel.            10.  Wear clean pajamas.            11.  Place clean sheets on your bed the night of your first shower and do not  sleep with pets. Day of Surgery : Do not apply any lotions/deodorants the morning of surgery.  Please wear clean clothes to the hospital/surgery center.  FAILURE TO FOLLOW THESE INSTRUCTIONS MAY RESULT IN THE CANCELLATION OF YOUR SURGERY PATIENT SIGNATURE_________________________________  NURSE SIGNATURE__________________________________  ________________________________________________________________________

## 2016-09-02 NOTE — Progress Notes (Signed)
05-16-16 (EPIC) CXR 08-19-16 (EPIC) CBC w/Diff, CMP

## 2016-09-05 ENCOUNTER — Encounter (HOSPITAL_COMMUNITY): Payer: Self-pay

## 2016-09-05 ENCOUNTER — Encounter (HOSPITAL_COMMUNITY)
Admission: RE | Admit: 2016-09-05 | Discharge: 2016-09-05 | Disposition: A | Discharge status: Home / Self Care | Payer: 59 | Source: Ambulatory Visit | Attending: Urology | Admitting: Urology

## 2016-09-05 ENCOUNTER — Other Ambulatory Visit: Payer: Self-pay

## 2016-09-05 DIAGNOSIS — Z01818 Encounter for other preprocedural examination: Secondary | ICD-10-CM | POA: Insufficient documentation

## 2016-09-05 DIAGNOSIS — I10 Essential (primary) hypertension: Secondary | ICD-10-CM | POA: Diagnosis not present

## 2016-09-05 HISTORY — DX: Essential (primary) hypertension: I10

## 2016-09-08 MED ORDER — GENTAMICIN SULFATE 40 MG/ML IJ SOLN
5.0000 mg/kg | INTRAVENOUS | Status: AC
  Administered 2016-09-09: 530 mg via INTRAVENOUS
  Filled 2016-09-08 (×2): qty 13.25

## 2016-09-09 ENCOUNTER — Ambulatory Visit (HOSPITAL_COMMUNITY): Payer: 59

## 2016-09-09 ENCOUNTER — Ambulatory Visit (HOSPITAL_COMMUNITY): Payer: 59 | Admitting: Certified Registered Nurse Anesthetist

## 2016-09-09 ENCOUNTER — Ambulatory Visit (HOSPITAL_COMMUNITY)
Admission: RE | Admit: 2016-09-09 | Discharge: 2016-09-09 | Disposition: A | Discharge status: Home / Self Care | Payer: 59 | Source: Ambulatory Visit | Attending: Urology | Admitting: Urology

## 2016-09-09 ENCOUNTER — Encounter (HOSPITAL_COMMUNITY)
Admission: RE | Disposition: A | Discharge status: Home / Self Care | Payer: Self-pay | Source: Ambulatory Visit | Attending: Urology

## 2016-09-09 ENCOUNTER — Encounter (HOSPITAL_COMMUNITY): Payer: Self-pay | Admitting: *Deleted

## 2016-09-09 DIAGNOSIS — I10 Essential (primary) hypertension: Secondary | ICD-10-CM | POA: Insufficient documentation

## 2016-09-09 DIAGNOSIS — Z6841 Body Mass Index (BMI) 40.0 and over, adult: Secondary | ICD-10-CM | POA: Insufficient documentation

## 2016-09-09 DIAGNOSIS — F419 Anxiety disorder, unspecified: Secondary | ICD-10-CM | POA: Diagnosis not present

## 2016-09-09 DIAGNOSIS — Z87442 Personal history of urinary calculi: Secondary | ICD-10-CM | POA: Insufficient documentation

## 2016-09-09 DIAGNOSIS — N132 Hydronephrosis with renal and ureteral calculous obstruction: Secondary | ICD-10-CM | POA: Diagnosis not present

## 2016-09-09 DIAGNOSIS — K219 Gastro-esophageal reflux disease without esophagitis: Secondary | ICD-10-CM | POA: Diagnosis not present

## 2016-09-09 DIAGNOSIS — Z79899 Other long term (current) drug therapy: Secondary | ICD-10-CM | POA: Insufficient documentation

## 2016-09-09 DIAGNOSIS — G4730 Sleep apnea, unspecified: Secondary | ICD-10-CM | POA: Diagnosis not present

## 2016-09-09 DIAGNOSIS — F329 Major depressive disorder, single episode, unspecified: Secondary | ICD-10-CM | POA: Diagnosis not present

## 2016-09-09 HISTORY — PX: HOLMIUM LASER APPLICATION: SHX5852

## 2016-09-09 HISTORY — PX: CYSTOSCOPY WITH RETROGRADE PYELOGRAM, URETEROSCOPY AND STENT PLACEMENT: SHX5789

## 2016-09-09 SURGERY — CYSTOURETEROSCOPY, WITH RETROGRADE PYELOGRAM AND STENT INSERTION
Anesthesia: General | Site: Ureter | Laterality: Left

## 2016-09-09 MED ORDER — SUGAMMADEX SODIUM 500 MG/5ML IV SOLN
INTRAVENOUS | Status: AC
Start: 2016-09-09 — End: ?
  Filled 2016-09-09: qty 5

## 2016-09-09 MED ORDER — EPHEDRINE SULFATE-NACL 50-0.9 MG/10ML-% IV SOSY
PREFILLED_SYRINGE | INTRAVENOUS | Status: DC | PRN
  Administered 2016-09-09 (×2): 10 mg via INTRAVENOUS

## 2016-09-09 MED ORDER — SUCCINYLCHOLINE CHLORIDE 20 MG/ML IJ SOLN
INTRAMUSCULAR | Status: DC | PRN
  Administered 2016-09-09: 200 mg via INTRAVENOUS

## 2016-09-09 MED ORDER — PROPOFOL 10 MG/ML IV BOLUS
INTRAVENOUS | Status: DC | PRN
  Administered 2016-09-09: 200 mg via INTRAVENOUS

## 2016-09-09 MED ORDER — ONDANSETRON HCL 4 MG/2ML IJ SOLN
INTRAMUSCULAR | Status: AC
  Filled 2016-09-09: qty 2

## 2016-09-09 MED ORDER — OXYCODONE-ACETAMINOPHEN 5-325 MG PO TABS: 1.0000 | ORAL_TABLET | Freq: Four times a day (QID) | ORAL | 0 refills | Status: DC | PRN

## 2016-09-09 MED ORDER — 0.9 % SODIUM CHLORIDE (POUR BTL) OPTIME
TOPICAL | Status: DC | PRN
  Administered 2016-09-09: 1000 mL

## 2016-09-09 MED ORDER — LIDOCAINE 2% (20 MG/ML) 5 ML SYRINGE
INTRAMUSCULAR | Status: AC
  Filled 2016-09-09: qty 5

## 2016-09-09 MED ORDER — SENNOSIDES-DOCUSATE SODIUM 8.6-50 MG PO TABS: 1.0000 | ORAL_TABLET | Freq: Two times a day (BID) | ORAL | 0 refills | Status: DC

## 2016-09-09 MED ORDER — LACTATED RINGERS IV SOLN
INTRAVENOUS | Status: DC
  Administered 2016-09-09 (×3): via INTRAVENOUS

## 2016-09-09 MED ORDER — SUCCINYLCHOLINE CHLORIDE 200 MG/10ML IV SOSY
PREFILLED_SYRINGE | INTRAVENOUS | Status: AC
  Filled 2016-09-09: qty 10

## 2016-09-09 MED ORDER — DEXAMETHASONE SODIUM PHOSPHATE 10 MG/ML IJ SOLN
INTRAMUSCULAR | Status: AC
  Filled 2016-09-09: qty 1

## 2016-09-09 MED ORDER — EPHEDRINE 5 MG/ML INJ
INTRAVENOUS | Status: AC
  Filled 2016-09-09: qty 10

## 2016-09-09 MED ORDER — PROPOFOL 10 MG/ML IV BOLUS
INTRAVENOUS | Status: AC
  Filled 2016-09-09: qty 20

## 2016-09-09 MED ORDER — SUGAMMADEX SODIUM 500 MG/5ML IV SOLN
INTRAVENOUS | Status: DC | PRN
  Administered 2016-09-09: 350 mg via INTRAVENOUS

## 2016-09-09 MED ORDER — ROCURONIUM BROMIDE 50 MG/5ML IV SOSY
PREFILLED_SYRINGE | INTRAVENOUS | Status: AC
Start: 2016-09-09 — End: ?
  Filled 2016-09-09: qty 5

## 2016-09-09 MED ORDER — KETOROLAC TROMETHAMINE 10 MG PO TABS: 10.0000 mg | ORAL_TABLET | Freq: Four times a day (QID) | ORAL | 1 refills | Status: DC | PRN

## 2016-09-09 MED ORDER — HYDROMORPHONE HCL 1 MG/ML IJ SOLN: 0.2500 mg | INTRAMUSCULAR | Status: DC | PRN

## 2016-09-09 MED ORDER — IOHEXOL 300 MG/ML  SOLN
INTRAMUSCULAR | Status: DC | PRN
  Administered 2016-09-09: 20 mL via URETHRAL

## 2016-09-09 MED ORDER — PROMETHAZINE HCL 25 MG/ML IJ SOLN: 6.2500 mg | INTRAMUSCULAR | Status: DC | PRN

## 2016-09-09 MED ORDER — ROCURONIUM BROMIDE 50 MG/5ML IV SOSY
PREFILLED_SYRINGE | INTRAVENOUS | Status: AC
  Filled 2016-09-09: qty 5

## 2016-09-09 MED ORDER — ROCURONIUM BROMIDE 100 MG/10ML IV SOLN
INTRAVENOUS | Status: DC | PRN
  Administered 2016-09-09: 20 mg via INTRAVENOUS

## 2016-09-09 MED ORDER — FENTANYL CITRATE (PF) 250 MCG/5ML IJ SOLN
INTRAMUSCULAR | Status: AC
  Filled 2016-09-09: qty 5

## 2016-09-09 MED ORDER — SUGAMMADEX SODIUM 500 MG/5ML IV SOLN
INTRAVENOUS | Status: AC
  Filled 2016-09-09: qty 5

## 2016-09-09 MED ORDER — DEXAMETHASONE SODIUM PHOSPHATE 10 MG/ML IJ SOLN
INTRAMUSCULAR | Status: DC | PRN
  Administered 2016-09-09: 10 mg via INTRAVENOUS

## 2016-09-09 MED ORDER — ONDANSETRON HCL 4 MG/2ML IJ SOLN
INTRAMUSCULAR | Status: DC | PRN
  Administered 2016-09-09: 4 mg via INTRAVENOUS

## 2016-09-09 MED ORDER — SODIUM CHLORIDE 0.9 % IR SOLN
Status: DC | PRN
  Administered 2016-09-09: 1000 mL via INTRAVESICAL
  Administered 2016-09-09: 4000 mL via INTRAVESICAL

## 2016-09-09 MED ORDER — LIDOCAINE HCL (CARDIAC) 20 MG/ML IV SOLN
INTRAVENOUS | Status: DC | PRN
  Administered 2016-09-09: 60 mg via INTRAVENOUS

## 2016-09-09 MED ORDER — FENTANYL CITRATE (PF) 100 MCG/2ML IJ SOLN
INTRAMUSCULAR | Status: DC | PRN
  Administered 2016-09-09: 100 ug via INTRAVENOUS

## 2016-09-09 MED ORDER — OXYCODONE HCL 5 MG/5ML PO SOLN: 5.0000 mg | Freq: Once | ORAL | Status: DC | PRN

## 2016-09-09 MED ORDER — MIDAZOLAM HCL 2 MG/2ML IJ SOLN
INTRAMUSCULAR | Status: AC
Start: 2016-09-09 — End: ?
  Filled 2016-09-09: qty 2

## 2016-09-09 MED ORDER — OXYCODONE HCL 5 MG PO TABS: 5.0000 mg | ORAL_TABLET | Freq: Once | ORAL | Status: DC | PRN

## 2016-09-09 MED ORDER — MIDAZOLAM HCL 5 MG/5ML IJ SOLN
INTRAMUSCULAR | Status: DC | PRN
  Administered 2016-09-09: 2 mg via INTRAVENOUS

## 2016-09-09 SURGICAL SUPPLY — 24 items
BAG URO CATCHER STRL LF (MISCELLANEOUS) ×2 IMPLANT
BASKET LASER NITINOL 1.9FR (BASKET) IMPLANT
CATH INTERMIT  6FR 70CM (CATHETERS) ×2 IMPLANT
CLOTH BEACON ORANGE TIMEOUT ST (SAFETY) ×2 IMPLANT
COVER SURGICAL LIGHT HANDLE (MISCELLANEOUS) IMPLANT
FIBER LASER FLEXIVA 1000 (UROLOGICAL SUPPLIES) IMPLANT
FIBER LASER FLEXIVA 365 (UROLOGICAL SUPPLIES) IMPLANT
FIBER LASER FLEXIVA 550 (UROLOGICAL SUPPLIES) IMPLANT
FIBER LASER TRAC TIP (UROLOGICAL SUPPLIES) ×2 IMPLANT
GLOVE BIOGEL M STRL SZ7.5 (GLOVE) ×2 IMPLANT
GOWN STRL REUS W/TWL LRG LVL3 (GOWN DISPOSABLE) ×4 IMPLANT
GUIDEWIRE ANG ZIPWIRE 038X150 (WIRE) ×2 IMPLANT
GUIDEWIRE STR DUAL SENSOR (WIRE) ×2 IMPLANT
IV NS 1000ML (IV SOLUTION) ×1
IV NS 1000ML BAXH (IV SOLUTION) ×1 IMPLANT
MANIFOLD NEPTUNE II (INSTRUMENTS) ×2 IMPLANT
PACK CYSTO (CUSTOM PROCEDURE TRAY) ×2 IMPLANT
SHEATH ACCESS URETERAL 24CM (SHEATH) IMPLANT
SHEATH ACCESS URETERAL 38CM (SHEATH) ×2 IMPLANT
SHEATH ACCESS URETERAL 54CM (SHEATH) IMPLANT
STENT URET 6FRX26 CONTOUR (STENTS) ×2 IMPLANT
SYR CONTROL 10ML LL (SYRINGE) ×2 IMPLANT
TUBE FEEDING 8FR 16IN STR KANG (MISCELLANEOUS) ×2 IMPLANT
TUBING CONNECTING 10 (TUBING) ×2 IMPLANT

## 2016-09-09 NOTE — Anesthesia Preprocedure Evaluation (Addendum)
Anesthesia Evaluation  Patient identified by MRN, date of birth, ID band Patient awake    Reviewed: Allergy & Precautions, NPO status , Patient's Chart, lab work & pertinent test results  Airway Mallampati: III  TM Distance: >3 FB Neck ROM: Full    Dental no notable dental hx.    Pulmonary neg pulmonary ROS, sleep apnea ,    Pulmonary exam normal breath sounds clear to auscultation       Cardiovascular hypertension, Pt. on medications negative cardio ROS Normal cardiovascular exam Rhythm:Regular Rate:Normal     Neuro/Psych Anxiety Depression negative neurological ROS  negative psych ROS   GI/Hepatic negative GI ROS, Neg liver ROS,   Endo/Other  negative endocrine ROSMorbid obesity  Renal/GU Renal diseasenegative Renal ROS  negative genitourinary   Musculoskeletal negative musculoskeletal ROS (+)   Abdominal   Peds negative pediatric ROS (+)  Hematology negative hematology ROS (+)   Anesthesia Other Findings   Reproductive/Obstetrics negative OB ROS                            Anesthesia Physical Anesthesia Plan  ASA: III  Anesthesia Plan: General   Post-op Pain Management:    Induction: Intravenous  PONV Risk Score and Plan: 4 or greater and Ondansetron, Dexamethasone, Propofol, Midazolam and Treatment may vary due to age or medical condition  Airway Management Planned: Oral ETT  Additional Equipment:   Intra-op Plan:   Post-operative Plan: Extubation in OR  Informed Consent: I have reviewed the patients History and Physical, chart, labs and discussed the procedure including the risks, benefits and alternatives for the proposed anesthesia with the patient or authorized representative who has indicated his/her understanding and acceptance.   Dental advisory given  Plan Discussed with: CRNA  Anesthesia Plan Comments:         Anesthesia Quick Evaluation

## 2016-09-09 NOTE — Transfer of Care (Signed)
Immediate Anesthesia Transfer of Care Note  Patient: RAEL YO  Procedure(s) Performed: Procedure(s): CYSTOSCOPY WITH RETROGRADE PYELOGRAM, URETEROSCOPY AND STENT PLACEMENT, FIRST STAGE (Left) HOLMIUM LASER APPLICATION (Left)  Patient Location: PACU  Anesthesia Type:General  Level of Consciousness: awake and alert   Airway & Oxygen Therapy: Patient Spontanous Breathing and Patient connected to face mask oxygen  Post-op Assessment: Report given to RN and Post -op Vital signs reviewed and stable  Post vital signs: Reviewed and stable  Last Vitals:  Vitals:   09/09/16 1427 09/09/16 1740  BP: 115/86 118/74  Pulse: 77   Resp: 18   Temp: 36.6 C 36.6 C    Last Pain:  Vitals:   09/09/16 1740  TempSrc:   PainSc: 0-No pain      Patients Stated Pain Goal: 3 (68/34/19 6222)  Complications: No apparent anesthesia complications

## 2016-09-09 NOTE — Brief Op Note (Signed)
09/09/2016  5:24 PM  PATIENT:  Jose Adkins  41 y.o. male  PRE-OPERATIVE DIAGNOSIS:  LEFT RENAL PELVIS STONE  POST-OPERATIVE DIAGNOSIS:  LEFT RENAL PELVIS STONE  PROCEDURE:  Procedure(s): CYSTOSCOPY WITH RETROGRADE PYELOGRAM, URETEROSCOPY AND STENT PLACEMENT FIRST STAGE (Left) HOLMIUM LASER APPLICATION (Left)  SURGEON:  Surgeon(s) and Role:    Alexis Frock, MD - Primary  PHYSICIAN ASSISTANT:   ASSISTANTS: none   ANESTHESIA:   general  EBL:  No intake/output data recorded.  BLOOD ADMINISTERED:none  DRAINS: none   LOCAL MEDICATIONS USED:  NONE  SPECIMEN:  No Specimen  DISPOSITION OF SPECIMEN:  N/A  COUNTS:  YES  TOURNIQUET:  * No tourniquets in log *  DICTATION: .Other Dictation: Dictation Number [id number]  PLAN OF CARE: Discharge to home after PACU  PATIENT DISPOSITION:  PACU - hemodynamically stable.   Delay start of Pharmacological VTE agent (>24hrs) due to surgical blood loss or risk of bleeding: yes

## 2016-09-09 NOTE — H&P (Signed)
Jose Adkins is an 41 y.o. male.    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 Stone is solitary, 8mm, 1050HU, SSD >13cm. Most recent UCX negative.  Today "Jose Adkins" is seen to proceed with LEFT ureteroscopic stone manipulation. No interval fevers.    Past Medical History:  Diagnosis Date  . Anxiety   . Depression   . Hypertension   . Kidney stone   . Kidney stones   . Low testosterone   . Reflux   . Sleep apnea     Past Surgical History:  Procedure Laterality Date  . VASECTOMY  2002    No family history on file. Social History:  reports that he has never smoked. He has never used smokeless tobacco. He reports that he does not drink alcohol or use drugs.  Allergies: No Known Allergies  No prescriptions prior to admission.    No results found for this or any previous visit (from the past 48 hour(s)). No results found.  Review of Systems  Constitutional: Negative.  Negative for chills and fever.  HENT: Negative.   Eyes: Negative.   Respiratory: Negative.   Cardiovascular: Negative.   Gastrointestinal: Negative.   Genitourinary: Negative.   Musculoskeletal: Negative.   Skin: Negative.   Neurological: Negative.   Endo/Heme/Allergies: Negative.   Psychiatric/Behavioral: Negative.     There were no vitals taken for this visit. Physical Exam  Constitutional: He appears well-developed.  HENT:  Head: Normocephalic.  Eyes: Pupils are equal, round, and reactive to light.  Neck: Normal range of motion.  Cardiovascular: Normal rate.   Respiratory: Effort normal.  GI: Soft.  Morbid truncal obesity limits sensitivity of exam.   Genitourinary: Penis normal.  Genitourinary Comments: NO CVAT at present.   Musculoskeletal: Normal range of motion.  Neurological: He is alert.  Skin: Skin is warm.   Psychiatric: He has a normal mood and affect. His behavior is normal. Thought content normal.     Assessment/Plan Proceed as planned with LEFT ureteroscopic stone manipulation. Risks, benefits, alternatives, expected peri-op course, very real need for possible staged approach given stone size and variant anatomy frankly discussed.   Alexis Frock, MD 09/09/2016, 6:57 AM

## 2016-09-09 NOTE — Anesthesia Procedure Notes (Signed)
Procedure Name: Intubation Date/Time: 09/09/2016 4:10 PM Performed by: Gaston Islam EVETTE Pre-anesthesia Checklist: Patient identified, Suction available, Patient being monitored and Emergency Drugs available Patient Re-evaluated:Patient Re-evaluated prior to inductionOxygen Delivery Method: Circle system utilized Preoxygenation: Pre-oxygenation with 100% oxygen Intubation Type: IV induction Ventilation: Two handed mask ventilation required Laryngoscope Size: 4 Grade View: Grade I Tube type: Oral Tube size: 7.5 mm Number of attempts: 1 Airway Equipment and Method: Patient positioned with wedge pillow Placement Confirmation: ETT inserted through vocal cords under direct vision,  positive ETCO2,  CO2 detector and breath sounds checked- equal and bilateral Secured at: 22 cm Tube secured with: Tape Dental Injury: Teeth and Oropharynx as per pre-operative assessment  Comments: Pt has a large beard

## 2016-09-09 NOTE — Anesthesia Postprocedure Evaluation (Signed)
Anesthesia Post Note  Patient: KAYL STOGDILL  Procedure(s) Performed: Procedure(s) (LRB): CYSTOSCOPY WITH RETROGRADE PYELOGRAM, URETEROSCOPY AND STENT PLACEMENT, FIRST STAGE (Left) HOLMIUM LASER APPLICATION (Left)     Patient location during evaluation: PACU Anesthesia Type: General Level of consciousness: awake and alert Pain management: pain level controlled Vital Signs Assessment: post-procedure vital signs reviewed and stable Respiratory status: spontaneous breathing, nonlabored ventilation and respiratory function stable Cardiovascular status: blood pressure returned to baseline and stable Postop Assessment: no signs of nausea or vomiting Anesthetic complications: no    Last Vitals:  Vitals:   09/09/16 1745 09/09/16 1800  BP: 127/74 119/74  Pulse: (!) 102 98  Resp: 19 18  Temp:  36.8 C    Last Pain:  Vitals:   09/09/16 1740  TempSrc:   PainSc: 0-No pain                 Lynda Rainwater

## 2016-09-09 NOTE — Discharge Instructions (Signed)
1 - You may have urinary urgency (bladder spasms) and bloody urine on / off with stent in place. This is normal. ° °2 - Call MD or go to ER for fever >102, severe pain / nausea / vomiting not relieved by medications, or acute change in medical status ° °

## 2016-09-10 ENCOUNTER — Encounter (HOSPITAL_COMMUNITY): Payer: Self-pay | Admitting: Urology

## 2016-09-10 NOTE — Op Note (Signed)
NAME:  Jose Adkins, Jose Adkins NO.:  [id number]  MEDICAL RECORD NO.:  [id number]  LOCATION:                                FACILITY:  [facility]  PHYSICIAN:  Alexis Frock, MD     DATE OF BIRTH:  [birth year]  DATE OF PROCEDURE: 09/09/2016                               OPERATIVE REPORT   DIAGNOSES:  Large left ureteropelvic junction stone and horseshoe kidney with intermittent obstruction.  PROCEDURE: 1. Cystoscopy with left retrograde pyelogram and interpretation. 2. Left first-stage ureteroscopy laser lithotripsy. 3. Insertion of left ureteral stent, 6 x 26 Contour, no tether.  ESTIMATED BLOOD LOSS:  Nil.  COMPLICATION:  None.  SPECIMEN:  None.  FINDINGS: 1. Horseshoe kidney with lateral insertion of left ureter as     anticipated. 2. Very large very dense left UPJ stone with likely intermittent ball     valve obstruction. 3. Ablation of approximately 60% to 70% of left UPJ stone today using     dusting technique over a period of 90 minutes. 4. Extreme obesity.  INDICATIONS:  Jose Adkins is a very pleasant 41 year old gentleman with history of extreme obesity and nephrolithiasis and horseshoe kidney.  He was found on workup of colicky left pelvic and flank pain to have a quite large approximately 13 mm, very dense, approximately 200 Hounsfield unit of left UPJ stone with likely intermittent obstruction. Options were discussed for management including surveillance versus lithotripsy versus ureteroscopy versus percutaneous approach surgery, the patient wished to proceed with ureteroscopy, possible staged. Informed consent was obtained and placed in the medical record.  PROCEDURE IN DETAIL:  The patient being, Jose Adkins, verified, and procedure being left ureteroscopic stone manipulation confirmed. Procedure was carried out.  Time-out was performed.  Intravenous antibiotics were administered.  General endotracheal anesthesia introduced.  The patient was placed  into a low lithotomy position. Sterile field was created by prepping and draping the patient's penis, perineum, proximal thighs using iodine.  Next, cystourethroscopy was performed using a 22-French rigid cystoscope with offset lens. Inspection of the anterior and posterior urethra were unremarkable. Inspection of bladder revealed no diverticula, calcifications, papillary lesions.  The left ureteral orifice was cannulated with a 6-French end- hole catheter and left retrograde pyelogram was obtained.  Left ventriculogram demonstrated a single left ureter with single-system left kidney.  This was consistent with a horseshoe type as there was a very delicate intrarenal anatomy and lateral insertion of the ureter without really any renal pelvis whatsoever.  There was large calcification turned filling defect in the area of the UPJ consistent with known likely intermittent ball valving stone.  A 0.038 ZIPwire was advanced to the lower pole, set aside as a safety wire.  An 8-French feeding tube placed in urinary bladder for pressure release. Next, semi-rigid ureteroscopy was performed in the distal orifice of the left ureter alongside a separate Sensor working wire.  No mucosal abnormalities were found.  The semi-rigid scope was exchanged for a 12/14, 38-cm ureteral access sheath using continuous fluoroscopic guidance to the level of proximal ureter, and flexible digital ureteroscopy was performed in the left proximal ureter and systematic inspection of the left kidney using a single channel  ureteroscope.  As anticipated, there was a quite large stone with likely intermittent obstruction at the area of the UPJ.  The stone was much too large for simple basketing, it was quite dense appearing, as such holmium laser energy applied to the stone, initially at settings of 0.2 joules and 20 hertz in a dusting technique and ablation energy was applied to the stone at these settings and finally  escalating to 0.2 joules and 50 hertz for a period of approximately 90 minutes.  During this time approximately 60% to 70% of the total stone volume was ablated with this technique, and given the patient's extreme obesity and very large stone, it was felt that a staged approach would likely be warranted keeping him in lithotomy position.  Incision was made.  The kidney was inspected again, there was no evidence of renal perforation.  The access sheath was removed under continuous vision.  No mucosal abnormalities were found.  Finally, a new 6 x 26 Contour-type stent was placed using cystoscopic and fluoroscopic guidance.  Good proximal and distal deployment were noted.  Bladder was emptied per cystoscope.  Procedure then terminated.  The patient tolerated procedure well.  There were no immediate periprocedural complications.  The patient was taken to the postanesthesia care in stable condition.          ______________________________ Alexis Frock, MD     TM/MEDQ  D:  09/09/2016  T:  09/09/2016  Job:  [id number]

## 2016-09-10 NOTE — Op Note (Deleted)
  The note originally documented on this encounter has been moved the the encounter in which it belongs.  

## 2016-09-12 ENCOUNTER — Encounter (HOSPITAL_COMMUNITY): Payer: Self-pay | Admitting: Urology

## 2016-09-12 ENCOUNTER — Other Ambulatory Visit: Payer: Self-pay | Admitting: Urology

## 2016-09-16 ENCOUNTER — Telehealth: Payer: Self-pay | Admitting: Family Medicine

## 2016-09-16 NOTE — Telephone Encounter (Signed)
Left message return call 09/16/16

## 2016-09-16 NOTE — Telephone Encounter (Signed)
Please let the patient know his insurance company notified us that he had a prescription for nap percent as well as ketorolac filled within 2 weeks of each other. Both of these are anti-inflammatories. Both of these should not be used on the same day. (The second one is for kidney stone pain prescribed by his urologist I am fine with the medicine but just not to take it with aura on the same day as Naprosyn) this phone call is just more of a safety phone call

## 2016-09-19 NOTE — Telephone Encounter (Signed)
Patient advised his insurance company notified us that he had a prescription for naproxen as well as ketorolac filled within 2 weeks of each other. Both of these are anti-inflammatories. Both of these should not be used on the same day. (The second one is for kidney stone pain prescribed by his urologist Dr Nicki Reaper is fine with the medicine but just not to take it on the same day as Naprosyn) this phone call is just more of a safety phone call. Patient verbalized understanding.

## 2016-09-27 ENCOUNTER — Encounter (HOSPITAL_COMMUNITY): Payer: Self-pay | Admitting: *Deleted

## 2016-09-29 MED ORDER — GENTAMICIN SULFATE 40 MG/ML IJ SOLN
500.0000 mg | INTRAVENOUS | Status: AC
  Administered 2016-09-30: 500 mg via INTRAVENOUS
  Filled 2016-09-29 (×2): qty 12.5

## 2016-09-30 ENCOUNTER — Ambulatory Visit (HOSPITAL_COMMUNITY): Payer: No Typology Code available for payment source | Admitting: Anesthesiology

## 2016-09-30 ENCOUNTER — Encounter (HOSPITAL_COMMUNITY)
Admission: RE | Disposition: A | Discharge status: Home / Self Care | Payer: Self-pay | Source: Ambulatory Visit | Attending: Urology

## 2016-09-30 ENCOUNTER — Ambulatory Visit (HOSPITAL_COMMUNITY): Payer: No Typology Code available for payment source

## 2016-09-30 ENCOUNTER — Encounter (HOSPITAL_COMMUNITY): Payer: Self-pay | Admitting: *Deleted

## 2016-09-30 ENCOUNTER — Ambulatory Visit (HOSPITAL_COMMUNITY)
Admission: RE | Admit: 2016-09-30 | Discharge: 2016-09-30 | Disposition: A | Discharge status: Home / Self Care | Payer: No Typology Code available for payment source | Source: Ambulatory Visit | Attending: Urology | Admitting: Urology

## 2016-09-30 DIAGNOSIS — F419 Anxiety disorder, unspecified: Secondary | ICD-10-CM | POA: Diagnosis not present

## 2016-09-30 DIAGNOSIS — F329 Major depressive disorder, single episode, unspecified: Secondary | ICD-10-CM | POA: Insufficient documentation

## 2016-09-30 DIAGNOSIS — N202 Calculus of kidney with calculus of ureter: Secondary | ICD-10-CM | POA: Insufficient documentation

## 2016-09-30 DIAGNOSIS — Z6841 Body Mass Index (BMI) 40.0 and over, adult: Secondary | ICD-10-CM | POA: Diagnosis not present

## 2016-09-30 DIAGNOSIS — I10 Essential (primary) hypertension: Secondary | ICD-10-CM | POA: Diagnosis not present

## 2016-09-30 DIAGNOSIS — Z79899 Other long term (current) drug therapy: Secondary | ICD-10-CM | POA: Insufficient documentation

## 2016-09-30 DIAGNOSIS — G4730 Sleep apnea, unspecified: Secondary | ICD-10-CM | POA: Diagnosis not present

## 2016-09-30 DIAGNOSIS — K219 Gastro-esophageal reflux disease without esophagitis: Secondary | ICD-10-CM | POA: Diagnosis not present

## 2016-09-30 DIAGNOSIS — N200 Calculus of kidney: Secondary | ICD-10-CM | POA: Diagnosis present

## 2016-09-30 DIAGNOSIS — Q631 Lobulated, fused and horseshoe kidney: Secondary | ICD-10-CM | POA: Insufficient documentation

## 2016-09-30 HISTORY — PX: CYSTOSCOPY WITH RETROGRADE PYELOGRAM, URETEROSCOPY AND STENT PLACEMENT: SHX5789

## 2016-09-30 HISTORY — PX: HOLMIUM LASER APPLICATION: SHX5852

## 2016-09-30 LAB — CBC
HCT: 40.9 % (ref 39.0–52.0)
Hemoglobin: 13.8 g/dL (ref 13.0–17.0)
MCH: 28.2 pg (ref 26.0–34.0)
MCHC: 33.7 g/dL (ref 30.0–36.0)
MCV: 83.5 fL (ref 78.0–100.0)
PLATELETS: 194 10*3/uL (ref 150–400)
RBC: 4.9 MIL/uL (ref 4.22–5.81)
RDW: 14.1 % (ref 11.5–15.5)
WBC: 6.3 10*3/uL (ref 4.0–10.5)

## 2016-09-30 LAB — BASIC METABOLIC PANEL
Anion gap: 7 (ref 5–15)
BUN: 23 mg/dL — ABNORMAL HIGH (ref 6–20)
CHLORIDE: 105 mmol/L (ref 101–111)
CO2: 26 mmol/L (ref 22–32)
CREATININE: 1.48 mg/dL — AB (ref 0.61–1.24)
Calcium: 8.7 mg/dL — ABNORMAL LOW (ref 8.9–10.3)
GFR calc Af Amer: >60 mL/min (ref >60)
GFR calc non Af Amer: 57 mL/min — ABNORMAL LOW (ref >60)
GLUCOSE: 95 mg/dL (ref 65–99)
Potassium: 4.1 mmol/L (ref 3.5–5.1)
Sodium: 138 mmol/L (ref 135–145)

## 2016-09-30 SURGERY — CYSTOURETEROSCOPY, WITH RETROGRADE PYELOGRAM AND STENT INSERTION
Anesthesia: General | Laterality: Left

## 2016-09-30 MED ORDER — ONDANSETRON HCL 4 MG/2ML IJ SOLN
INTRAMUSCULAR | Status: DC | PRN
  Administered 2016-09-30: 4 mg via INTRAVENOUS

## 2016-09-30 MED ORDER — OXYCODONE-ACETAMINOPHEN 5-325 MG PO TABS: 1.0000 | ORAL_TABLET | Freq: Four times a day (QID) | ORAL | 0 refills | Status: DC | PRN

## 2016-09-30 MED ORDER — SUGAMMADEX SODIUM 500 MG/5ML IV SOLN
INTRAVENOUS | Status: AC
  Filled 2016-09-30: qty 5

## 2016-09-30 MED ORDER — HYDROMORPHONE HCL-NACL 0.5-0.9 MG/ML-% IV SOSY: 0.2500 mg | PREFILLED_SYRINGE | INTRAVENOUS | Status: DC | PRN

## 2016-09-30 MED ORDER — SUGAMMADEX SODIUM 500 MG/5ML IV SOLN
INTRAVENOUS | Status: DC | PRN
  Administered 2016-09-30: 340 mg via INTRAVENOUS

## 2016-09-30 MED ORDER — SUCCINYLCHOLINE CHLORIDE 200 MG/10ML IV SOSY
PREFILLED_SYRINGE | INTRAVENOUS | Status: DC | PRN
  Administered 2016-09-30: 200 mg via INTRAVENOUS

## 2016-09-30 MED ORDER — ROCURONIUM BROMIDE 10 MG/ML (PF) SYRINGE
PREFILLED_SYRINGE | INTRAVENOUS | Status: DC | PRN
  Administered 2016-09-30: 30 mg via INTRAVENOUS
  Administered 2016-09-30 (×2): 10 mg via INTRAVENOUS

## 2016-09-30 MED ORDER — FENTANYL CITRATE (PF) 100 MCG/2ML IJ SOLN
INTRAMUSCULAR | Status: DC | PRN
  Administered 2016-09-30: 50 ug via INTRAVENOUS
  Administered 2016-09-30: 100 ug via INTRAVENOUS
  Administered 2016-09-30: 50 ug via INTRAVENOUS

## 2016-09-30 MED ORDER — PROPOFOL 10 MG/ML IV BOLUS
INTRAVENOUS | Status: DC | PRN
  Administered 2016-09-30: 200 mg via INTRAVENOUS

## 2016-09-30 MED ORDER — SODIUM CHLORIDE 0.9 % IR SOLN
Status: DC | PRN
  Administered 2016-09-30: 3000 mL

## 2016-09-30 MED ORDER — LACTATED RINGERS IV SOLN
INTRAVENOUS | Status: DC
  Administered 2016-09-30 (×2): via INTRAVENOUS

## 2016-09-30 MED ORDER — PROPOFOL 10 MG/ML IV BOLUS
INTRAVENOUS | Status: AC
  Filled 2016-09-30: qty 40

## 2016-09-30 MED ORDER — MIDAZOLAM HCL 5 MG/5ML IJ SOLN
INTRAMUSCULAR | Status: DC | PRN
  Administered 2016-09-30: 2 mg via INTRAVENOUS

## 2016-09-30 MED ORDER — KETOROLAC TROMETHAMINE 10 MG PO TABS: 10.0000 mg | ORAL_TABLET | Freq: Four times a day (QID) | ORAL | 1 refills | Status: DC | PRN

## 2016-09-30 MED ORDER — FENTANYL CITRATE (PF) 100 MCG/2ML IJ SOLN
INTRAMUSCULAR | Status: AC
  Filled 2016-09-30: qty 2

## 2016-09-30 MED ORDER — DEXAMETHASONE SODIUM PHOSPHATE 10 MG/ML IJ SOLN
INTRAMUSCULAR | Status: DC | PRN
  Administered 2016-09-30: 10 mg via INTRAVENOUS

## 2016-09-30 MED ORDER — IOHEXOL 300 MG/ML  SOLN
INTRAMUSCULAR | Status: DC | PRN
  Administered 2016-09-30: 20 mL

## 2016-09-30 MED ORDER — CEPHALEXIN 500 MG PO CAPS: 500.0000 mg | ORAL_CAPSULE | Freq: Two times a day (BID) | ORAL | 0 refills | Status: DC

## 2016-09-30 MED ORDER — LIDOCAINE 2% (20 MG/ML) 5 ML SYRINGE
INTRAMUSCULAR | Status: DC | PRN
  Administered 2016-09-30: 60 mg via INTRAVENOUS

## 2016-09-30 MED ORDER — MIDAZOLAM HCL 2 MG/2ML IJ SOLN
INTRAMUSCULAR | Status: AC
  Filled 2016-09-30: qty 2

## 2016-09-30 MED ORDER — GLYCOPYRROLATE 0.2 MG/ML IV SOSY
PREFILLED_SYRINGE | INTRAVENOUS | Status: DC | PRN
  Administered 2016-09-30: .2 mg via INTRAVENOUS

## 2016-09-30 SURGICAL SUPPLY — 25 items
BAG URO CATCHER STRL LF (MISCELLANEOUS) ×2 IMPLANT
BASKET LASER NITINOL 1.9FR (BASKET) ×2 IMPLANT
BASKET STONE NCOMPASS (UROLOGICAL SUPPLIES) ×2 IMPLANT
CATH INTERMIT  6FR 70CM (CATHETERS) ×2 IMPLANT
CLOTH BEACON ORANGE TIMEOUT ST (SAFETY) ×2 IMPLANT
COVER SURGICAL LIGHT HANDLE (MISCELLANEOUS) IMPLANT
FIBER LASER FLEXIVA 1000 (UROLOGICAL SUPPLIES) IMPLANT
FIBER LASER FLEXIVA 365 (UROLOGICAL SUPPLIES) IMPLANT
FIBER LASER FLEXIVA 550 (UROLOGICAL SUPPLIES) IMPLANT
FIBER LASER TRAC TIP (UROLOGICAL SUPPLIES) ×2 IMPLANT
GLOVE BIOGEL M STRL SZ7.5 (GLOVE) ×2 IMPLANT
GOWN STRL REUS W/TWL LRG LVL3 (GOWN DISPOSABLE) ×4 IMPLANT
GUIDEWIRE ANG ZIPWIRE 038X150 (WIRE) IMPLANT
GUIDEWIRE STR DUAL SENSOR (WIRE) ×2 IMPLANT
IV NS 1000ML (IV SOLUTION)
IV NS 1000ML BAXH (IV SOLUTION) IMPLANT
MANIFOLD NEPTUNE II (INSTRUMENTS) ×2 IMPLANT
PACK CYSTO (CUSTOM PROCEDURE TRAY) ×2 IMPLANT
SHEATH ACCESS URETERAL 24CM (SHEATH) ×2 IMPLANT
SHEATH ACCESS URETERAL 38CM (SHEATH) IMPLANT
SHEATH ACCESS URETERAL 54CM (SHEATH) IMPLANT
STENT CONTOUR 6FRX26X.038 (STENTS) ×2 IMPLANT
SYR CONTROL 10ML LL (SYRINGE) ×2 IMPLANT
TUBE FEEDING 8FR 16IN STR KANG (MISCELLANEOUS) ×2 IMPLANT
TUBING CONNECTING 10 (TUBING) ×2 IMPLANT

## 2016-09-30 NOTE — Anesthesia Procedure Notes (Signed)
Procedure Name: Intubation Date/Time: 09/30/2016 2:34 PM Performed by: Nanie Dunkleberger, Virgel Gess Pre-anesthesia Checklist: Patient identified, Emergency Drugs available, Suction available, Patient being monitored and Timeout performed Patient Re-evaluated:Patient Re-evaluated prior to induction Oxygen Delivery Method: Circle system utilized Preoxygenation: Pre-oxygenation with 100% oxygen Induction Type: IV induction Ventilation: Mask ventilation without difficulty Laryngoscope Size: Mac and 4 Grade View: Grade I Tube type: Oral Tube size: 7.5 mm Number of attempts: 1 Airway Equipment and Method: Stylet Placement Confirmation: ETT inserted through vocal cords under direct vision,  positive ETCO2,  CO2 detector and breath sounds checked- equal and bilateral Secured at: 22 cm Tube secured with: Tape Dental Injury: Teeth and Oropharynx as per pre-operative assessment

## 2016-09-30 NOTE — Interval H&P Note (Signed)
History and Physical Interval Note:  09/30/2016 2:09 PM  Jose Adkins  has presented today for surgery, with the diagnosis of LEFT RENAL STONE IN HORSESHOE KIDNEY  The various methods of treatment have been discussed with the patient and family. After consideration of risks, benefits and other options for treatment, the patient has consented to  Procedure(s): 2ND STAGE CYSTOSCOPY WITH RETROGRADE PYELOGRAM, URETEROSCOPY AND STENT REPLACEMENT (Left) HOLMIUM LASER APPLICATION (Left) as a surgical intervention .  The patient's history has been reviewed, patient examined, no change in status, stable for surgery.  I have reviewed the patient's chart and labs.  Questions were answered to the patient's satisfaction.     Jose Adkins

## 2016-09-30 NOTE — Anesthesia Preprocedure Evaluation (Addendum)
Anesthesia Evaluation  Patient identified by MRN, date of birth, ID band Patient awake    Reviewed: Allergy & Precautions, H&P , NPO status , Patient's Chart, lab work & pertinent test results  Airway Mallampati: III  TM Distance: >3 FB Neck ROM: Full    Dental no notable dental hx. (+) Edentulous Upper, Dental Advisory Given   Pulmonary sleep apnea and Continuous Positive Airway Pressure Ventilation ,    Pulmonary exam normal breath sounds clear to auscultation       Cardiovascular hypertension, Pt. on medications  Rhythm:Regular Rate:Normal     Neuro/Psych Anxiety Depression negative neurological ROS     GI/Hepatic negative GI ROS, Neg liver ROS,   Endo/Other  Morbid obesity  Renal/GU Renal disease  negative genitourinary   Musculoskeletal   Abdominal   Peds  Hematology negative hematology ROS (+)   Anesthesia Other Findings   Reproductive/Obstetrics negative OB ROS                            Anesthesia Physical Anesthesia Plan  ASA: III  Anesthesia Plan: General   Post-op Pain Management:    Induction: Intravenous  PONV Risk Score and Plan: 3 and Ondansetron, Dexamethasone and Midazolam  Airway Management Planned: Oral ETT  Additional Equipment:   Intra-op Plan:   Post-operative Plan: Extubation in OR  Informed Consent: I have reviewed the patients History and Physical, chart, labs and discussed the procedure including the risks, benefits and alternatives for the proposed anesthesia with the patient or authorized representative who has indicated his/her understanding and acceptance.   Dental advisory given  Plan Discussed with: CRNA  Anesthesia Plan Comments:        Anesthesia Quick Evaluation

## 2016-09-30 NOTE — OR Nursing (Signed)
Stone taken by Dr. Manny 

## 2016-09-30 NOTE — Discharge Instructions (Signed)
General Anesthesia, Adult, Care After These instructions provide you with information about caring for yourself after your procedure. Your health care provider may also give you more specific instructions. Your treatment has been planned according to current medical practices, but problems sometimes occur. Call your health care provider if you have any problems or questions after your procedure. What can I expect after the procedure? After the procedure, it is common to have:  Vomiting.  A sore throat.  Mental slowness.  It is common to feel:  Nauseous.  Cold or shivery.  Sleepy.  Tired.  Sore or achy, even in parts of your body where you did not have surgery.  Follow these instructions at home: For at least 24 hours after the procedure:  Do not: ? Participate in activities where you could fall or become injured. ? Drive. ? Use heavy machinery. ? Drink alcohol. ? Take sleeping pills or medicines that cause drowsiness. ? Make important decisions or sign legal documents. ? Take care of children on your own.  Rest. Eating and drinking  If you vomit, drink water, juice, or soup when you can drink without vomiting.  Drink enough fluid to keep your urine clear or pale yellow.  Make sure you have little or no nausea before eating solid foods.  Follow the diet recommended by your health care provider. General instructions  Have a responsible adult stay with you until you are awake and alert.  Return to your normal activities as told by your health care provider. Ask your health care provider what activities are safe for you.  Take over-the-counter and prescription medicines only as told by your health care provider.  If you smoke, do not smoke without supervision.  Keep all follow-up visits as told by your health care provider. This is important. Contact a health care provider if:  You continue to have nausea or vomiting at home, and medicines are not helpful.  You  cannot drink fluids or start eating again.  You cannot urinate after 8-12 hours.  You develop a skin rash.  You have fever.  You have increasing redness at the site of your procedure. Get help right away if:  You have difficulty breathing.  You have chest pain.  You have unexpected bleeding.  You feel that you are having a life-threatening or urgent problem. This information is not intended to replace advice given to you by your health care provider. Make sure you discuss any questions you have with your health care provider. Document Released: 06/13/2000 Document Revised: 08/10/2015 Document Reviewed: 02/19/2015 Elsevier Interactive Patient Education  2018 [employer].     Ureteral Stent Implantation, Care After Refer to this sheet in the next few weeks. These instructions provide you with information about caring for yourself after your procedure. Your health care provider may also give you more specific instructions. Your treatment has been planned according to current medical practices, but problems sometimes occur. Call your health care provider if you have any problems or questions after your procedure. What can I expect after the procedure? After the procedure, it is common to have:  Nausea.  Mild pain when you urinate. You may feel this pain in your lower back or lower abdomen. Pain should stop within a few minutes after you urinate. This may last for up to 1 week.  A small amount of blood in your urine for several days.  Follow these instructions at home:  Medicines  Take over-the-counter and prescription medicines only as told by  your health care provider.  If you were prescribed an antibiotic medicine, take it as told by your health care provider. Do not stop taking the antibiotic even if you start to feel better.  Do not drive for 24 hours if you received a sedative.  Do not drive or operate heavy machinery while taking prescription pain  medicines. Activity  Return to your normal activities as told by your health care provider. Ask your health care provider what activities are safe for you.  Do not lift anything that is heavier than 10 lb (4.5 kg). Follow this limit for 1 week after your procedure, or for as long as told by your health care provider. General instructions  Watch for any blood in your urine. Call your health care provider if the amount of blood in your urine increases.  If you have a catheter: ? Follow instructions from your health care provider about taking care of your catheter and collection bag. ? Do not take baths, swim, or use a hot tub until your health care provider approves.  Drink enough fluid to keep your urine clear or pale yellow.  Keep all follow-up visits as told by your health care provider. This is important. Contact a health care provider if:  You have pain that gets worse or does not get better with medicine, especially pain when you urinate.  You have difficulty urinating.  You feel nauseous or you vomit repeatedly during a period of more than 2 days after the procedure. Get help right away if:  Your urine is dark red or has blood clots in it.  You are leaking urine (have incontinence).  The end of the stent comes out of your urethra.  You cannot urinate.  You have sudden, sharp, or severe pain in your abdomen or lower back.  You have a fever. This information is not intended to replace advice given to you by your health care provider. Make sure you discuss any questions you have with your health care provider. Document Released: 11/07/2012 Document Revised: 08/13/2015 Document Reviewed: 09/19/2014 Elsevier Interactive Patient Education  2018 [place] may have urinary urgency (bladder spasms) and bloody urine on / off with stent in place. This is normal.  2 - Remove tethered stent on Monday morning by pulling on string, then blue-white plastic tubing,  and discarding. Office is open Monday if any acute issues arise.   3 -  Call MD or go to ER for fever >102, severe pain / nausea / vomiting not relieved by medications, or acute change in medical status.

## 2016-09-30 NOTE — Addendum Note (Signed)
Addendum  created 09/30/16 1638 by Anne Fu, CRNA   Anesthesia Staff edited

## 2016-09-30 NOTE — Brief Op Note (Signed)
09/30/2016  3:21 PM  PATIENT:  Jose Adkins  41 y.o. 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  BLOOD ADMINISTERED:none  DRAINS: none   LOCAL MEDICATIONS USED:  NONE  SPECIMEN:  Source of Specimen:  Left renal / ureteral stone fragments  DISPOSITION OF SPECIMEN:  given to patient.   COUNTS:  YES  TOURNIQUET:  * No tourniquets in log *  DICTATION: .Other Dictation: Dictation Number [id number]  PLAN OF CARE: Discharge to home after PACU  PATIENT DISPOSITION:  PACU - hemodynamically stable.   Delay start of Pharmacological VTE agent (>24hrs) due to surgical blood loss or risk of bleeding: yes

## 2016-09-30 NOTE — H&P (Signed)
Jose Adkins is an 41 y.o. male.    Chief Complaint: Pre-op LEFT 2nd stage Ureteroscopic Stone Manipulation  HPI:   1 - Large LEFT UPJ Stone in Horseshoe Kidney - 75mm left UPJ sotne with hydro / early stranding by ER CT 2018 on eval pelvic / flank pain. Stone is solitary, 109mm, 1050HU, SSD >13cm. Most recent UCX negative.   Underwent left 1st stage ureteroscopic stone manipulation 08/2016 at which point estimated 60%+ stone ablated / removed.   Today "Jose Adkins" is seen to proceed with LEFT 2nd stage ureteroscopic stone manipulation with goal of stone free. No interval fevers.   Past Medical History:  Diagnosis Date  . Anxiety   . Depression   . Hypertension   . Kidney stone   . Kidney stones   . Low testosterone   . Reflux   . Sleep apnea     Past Surgical History:  Procedure Laterality Date  . CYSTOSCOPY WITH RETROGRADE PYELOGRAM, URETEROSCOPY AND STENT PLACEMENT Left 09/09/2016   Procedure: CYSTOSCOPY WITH RETROGRADE PYELOGRAM, URETEROSCOPY AND STENT PLACEMENT, FIRST STAGE;  Surgeon: Alexis Frock, MD;  Location: [facility];  Service: Urology;  Laterality: Left;  . HOLMIUM LASER APPLICATION Left 1/77/9390   Procedure: HOLMIUM LASER APPLICATION;  Surgeon: Alexis Frock, MD;  Location: [facility];  Service: Urology;  Laterality: Left;  Marland Kitchen VASECTOMY  2002    History reviewed. No pertinent family history. Social History:  reports that he has never smoked. He has never used smokeless tobacco. He reports that he does not drink alcohol or use drugs.  Allergies: No Known Allergies  No prescriptions prior to admission.    No results found for this or any previous visit (from the past 48 hour(s)). No results found.  Review of Systems  Constitutional: Negative.  Negative for chills and fever.  HENT: Negative.   Eyes: Negative.   Respiratory: Negative.   Cardiovascular: Negative.   Gastrointestinal: Negative.   Genitourinary: Positive for urgency.  Musculoskeletal: Negative.    Skin: Negative.   Neurological: Negative.   Endo/Heme/Allergies: Negative.   Psychiatric/Behavioral: Negative.     There were no vitals taken for this visit. Physical Exam  Constitutional: He appears well-developed.  HENT:  Head: Normocephalic.  Eyes: Pupils are equal, round, and reactive to light.  Neck: Normal range of motion.  Cardiovascular: Normal rate.   Respiratory: Effort normal.  GI: Soft.  Large truncal obesity, stable.   Genitourinary:  Genitourinary Comments: No CVAT.   Musculoskeletal: Normal range of motion.  Neurological: He is alert.  Skin: Skin is warm.  Psychiatric: He has a normal mood and affect.     Assessment/Plan  Proceed as planned with LEFT 2nd stage Ureteroscopic stone manipulation. Risks, benefits, alternatives, expected peri-op course discussed previously and reiterated today.   Alexis Frock, MD 09/30/2016, 6:29 AM

## 2016-09-30 NOTE — Anesthesia Postprocedure Evaluation (Signed)
Anesthesia Post Note  Patient: Jose Adkins  Procedure(s) Performed: Procedure(s) (LRB): 2ND STAGE CYSTOSCOPY WITH RETROGRADE PYELOGRAM, URETEROSCOPY AND STENT EXCHANGE (Left) HOLMIUM LASER APPLICATION (Left)     Patient location during evaluation: PACU Anesthesia Type: General Level of consciousness: awake and alert Pain management: pain level controlled Vital Signs Assessment: post-procedure vital signs reviewed and stable Respiratory status: spontaneous breathing, nonlabored ventilation and respiratory function stable Cardiovascular status: blood pressure returned to baseline and stable Postop Assessment: no signs of nausea or vomiting Anesthetic complications: no    Last Vitals:  Vitals:   09/30/16 1600 09/30/16 1611  BP: (!) 98/56 (!) 105/50  Pulse: 80 79  Resp: 15 16  Temp: 36.7 C 36.7 C    Last Pain:  Vitals:   09/30/16 1600  TempSrc:   PainSc: 0-No pain                 Cj Edgell,W. EDMOND

## 2016-09-30 NOTE — Transfer of Care (Signed)
Immediate Anesthesia Transfer of Care Note  Patient: Jose Adkins  Procedure(s) Performed: Procedure(s): 2ND STAGE CYSTOSCOPY WITH RETROGRADE PYELOGRAM, URETEROSCOPY AND STENT EXCHANGE (Left) HOLMIUM LASER APPLICATION (Left)  Patient Location: PACU  Anesthesia Type:General  Level of Consciousness:  sedated, patient cooperative and responds to stimulation  Airway & Oxygen Therapy:Patient Spontanous Breathing and Patient connected to face mask oxgen  Post-op Assessment:  Report given to PACU RN and Post -op Vital signs reviewed and stable  Post vital signs:  Reviewed and stable  Last Vitals:  Vitals:   09/30/16 1230  BP: 140/80  Pulse: 85  Resp: 20  Temp: 57.9 C    Complications: No apparent anesthesia complications

## 2016-10-03 ENCOUNTER — Encounter (HOSPITAL_COMMUNITY): Payer: Self-pay | Admitting: Urology

## 2016-10-03 NOTE — Op Note (Signed)
NAME:  CAL, GINDLESPERGER                    ACCOUNT NO.:  MEDICAL RECORD NO.:  [id number]  LOCATION:                                 FACILITY:  PHYSICIAN:  Alexis Frock, MD     DATE OF BIRTH:  08/09/1975  DATE OF PROCEDURE: 09/30/2016                              OPERATIVE REPORT   DIAGNOSIS:  Large left ureteropelvic junction stone and horseshoe kidney.  PROCEDURES: 1. Cystoscopy with left retrograde pyelogram and interpretation. 2. Exchange of left ureteral stent, 6 x 26 Contour with tether. 3. Left second-stage ureteroscopy with laser lithotripsy.  ESTIMATED BLOOD LOSS:  Nil.  COMPLICATION:  None.  SPECIMEN:  Left renal and ureteral stone fragments given the patient.  FINDINGS: 1. Approximately 8 mm2 total remaining stone volume in the lower pole,     horseshoe kidney and ureter. 2. Complete resolution of all stone fragments larger than 1 mm     following basket extraction with lithotripsy on the left side. 3. Successful replacement of the left ureteral stent, proximal in     renal pelvis and distal in the bladder.  INDICATION:  Mr. Cross is a very pleasant 41 year old gentleman with history of obesity, he was found on workup of colicky flank pain to have a left UPJ stone, quite large with likely intermittent obstruction of a horseshoe kidney.  Options were discussed for management including surveillance versus shockwave lithotripsy versus ureteroscopy likely in staged fashion.  He wished to proceed with staged ureteroscopy.  He underwent first-stage procedure several weeks ago, which he tolerated quite well, at which point, majority of the stone was felt to be avoided and removed.  Given his unfavorable intrarenal anatomy with acute angles from horseshoe kidney, it was clearly felt that second-stage procedure would be most advantageous as his goal of stone free and he presents for this today.  Informed consent was obtained and placed in the  medical record.  PROCEDURE IN DETAIL:  The patient being, Rorey Bisson, was verified. Procedure being left second-stage ureteroscopic stent placement was confirmed.  Procedure was carried out.  Time-out was performed. Intravenous antibiotics were administered.  General anesthesia was introduced.  The patient was placed into a low lithotomy position, and sterile field was created by prepping and draping the patient's penis, perineum and proximal thighs using iodine.  Next, cystourethroscopy was performed using a 22-French rigid cystoscope with offset lens. Inspection of the anterior and posterior urethra was unremarkable. Inspection of the urinary bladder revealed distal end of the left ureteral stent exiting the left ureteral orifice.  This was grasped, brought to the level of the urethral meatus, through which, a 0.038 Zip wire was advanced to the level of the kidney and the stent was exchanged for an open-ended catheter and left retrograde pyelogram was obtained.  Left retrograde pyelogram demonstrated single left ureter with single- system left kidney.  There was essentially no renal pelvis and quite significant acutely angled calices consistent with known horseshoe anatomy.  The Zip wire was once again advanced to the upper pole, set aside as a safety wire.  An 8-French feeding tube was placed in the urinary bladder for pressure release.  Next, semi-rigid  ureteroscopy was performed to the distal ureter alongside a separate Sensor working wire. There were multifocal small-volume ureteral stones likely representing antegrade progression of prior renal stone fragments, and these were grasped with an Escape basket and very carefully, sequentially removed from distal to proximal, thus creating a stone-free zone to the entire left ureter.  Next, the semi-rigid scope was exchanged for a 12/14, 24- cm ureteral access sheath at the level of the proximal ureter using continuous fluoroscopic  guidance over the Sensor working wire and flexible digital ureteroscopy was performed of the proximal left ureter and systematic inspection of the left kidney including all calices x3. There was small-to-moderate volume remaining intrarenal stone mostly in the mid and acutely positioned in lower pole calices as anticipated.  An Escape basket was initially used to remove the dominant fragments, after which, an Encompass basket was used to remove the vast majority of remaining stone fragments.  Following these maneuvers, there was only very small volume residual stone in the acutely angled lower pole, these stones were so small they would, they slipped through the Encompass basket.  As such, holmium laser energy was applied to the stone using settings of 1.5 joules and 10 hertz using a popcorn technique and these fragments were dusted in two pieces, 1/3 or less mm in diameter. Following these maneuvers, there was excellent hemostasis, no evidence of renal perforation.  We achieved the goals of procedure today.  The access sheath was removed under continuous vision, no mucosal abnormalities were found.  As such, a new 6 x 26 Contour-type stent was placed over the remaining safety wire using fluoroscopic guidance.  Good proximal and distal deployment were noted.  The tether was left in place and fashioned to the dorsum of the penis and procedure was terminated. The patient tolerated the procedure well.  There were no immediate periprocedural complications.  The patient was taken to the postanesthesia care unit in stable condition.          ______________________________ Alexis Frock, MD     TM/MEDQ  D:  09/30/2016  T:  10/01/2016  Job:  [id number]

## 2016-12-07 ENCOUNTER — Ambulatory Visit (INDEPENDENT_AMBULATORY_CARE_PROVIDER_SITE_OTHER): Payer: 59 | Admitting: Family Medicine

## 2016-12-07 ENCOUNTER — Encounter: Payer: Self-pay | Admitting: Family Medicine

## 2016-12-07 VITALS — BP 110/82 | Ht 67.0 in | Wt 364.0 lb

## 2016-12-07 DIAGNOSIS — Z13220 Encounter for screening for lipoid disorders: Secondary | ICD-10-CM | POA: Diagnosis not present

## 2016-12-07 DIAGNOSIS — M5431 Sciatica, right side: Secondary | ICD-10-CM | POA: Diagnosis not present

## 2016-12-07 DIAGNOSIS — I10 Essential (primary) hypertension: Secondary | ICD-10-CM

## 2016-12-07 DIAGNOSIS — R739 Hyperglycemia, unspecified: Secondary | ICD-10-CM

## 2016-12-07 MED ORDER — PREDNISONE 20 MG PO TABS: ORAL_TABLET | ORAL | 0 refills | Status: DC

## 2016-12-07 NOTE — Progress Notes (Signed)
   Subjective:    Patient ID: Jose Adkins, male    DOB: 13-Jul-1975, 41 y.o.   MRN: [id number]  HPI Patient comes in today with complaints of right leg tingling for a couple of weeks.Woke this am with it completely numb. This patient relates unusual sensations right leg he states his been to the past couple weeks he will cup earlier initially he told the nurse it was completely numb but when we discussed this further he states that it just feels numb on the upper anterior thigh toward the side of his thigh any feels like the leg doesn't want to respond to write normally does he denies headaches denies unilateral numbness weakness Nonenhanced leg. He also relates some pain and discomfort in the leg as well. He denies having this before but does have a history of low back pain and sciatica. He denies falling or tripping.  Review of Systems  Constitutional: Negative for activity change, fatigue and fever.  Respiratory: Negative for cough and shortness of breath.   Cardiovascular: Negative for chest pain and leg swelling.  Musculoskeletal: Positive for back pain and gait problem.  Neurological: Negative for headaches.       Objective:   Physical Exam  Constitutional: He appears well-nourished. No distress.  Cardiovascular: Normal rate, regular rhythm and normal heart sounds.   No murmur heard. Pulmonary/Chest: Effort normal and breath sounds normal. No respiratory distress.  Musculoskeletal: He exhibits no edema.  Lymphadenopathy:    He has no cervical adenopathy.  Neurological: He is alert.  Psychiatric: His behavior is normal.  Vitals reviewed.  Negative straight leg raise or bruits tight on the right side he has good strength in the left leg on the right leg slight weakness in the quadriceps was able to stay with his foot and raise up with his great toe he is able to walk but he walks slowly because he has pain in the right leg specifically the anterior superior aspect         Assessment & Plan:  Right leg sciatica with slight weakness probably more so related to the pain Patient does not one any pain medicine will use OTC measures Prednisone taper should help with sciatica symptoms and numbness Recheck next week Lab work this weekend to screen for cholesterol sugar etc. Warning signs were discussed If acutely worse go to ER otherwise follow-up next week if progressive symptoms or not doing better by next week may end up needing to have nerve conduction study/EMG-possible MRI-I do not find evidence of any type of stroke with this patient currently

## 2016-12-15 ENCOUNTER — Ambulatory Visit: Payer: 59 | Admitting: Family Medicine

## 2017-02-01 ENCOUNTER — Telehealth: Payer: Self-pay | Admitting: Family Medicine

## 2017-02-01 NOTE — Telephone Encounter (Signed)
Requesting Rx for klonopin to [facility].

## 2017-02-01 NOTE — Telephone Encounter (Signed)
This and 2 refills follow up ov in 3 months

## 2017-02-02 MED ORDER — CLONAZEPAM 0.5 MG PO TABS: ORAL_TABLET | ORAL | 2 refills | Status: DC

## 2017-02-02 NOTE — Telephone Encounter (Signed)
Spoke with patient and informed him per Dr.Scott Luking-we are sending in refills on your Klonopin to [place]. Will need an office visit in 3 months. Patient verbalized understanding.

## 2017-02-13 ENCOUNTER — Other Ambulatory Visit: Payer: Self-pay | Admitting: Family Medicine

## 2017-02-13 NOTE — Telephone Encounter (Signed)
Last seen 12/07/16

## 2017-03-28 ENCOUNTER — Other Ambulatory Visit: Payer: Self-pay | Admitting: Family Medicine

## 2017-03-29 NOTE — Telephone Encounter (Signed)
May have this +2 refill needs follow-up office visit in the spring

## 2017-03-29 NOTE — Telephone Encounter (Signed)
Duplicate

## 2017-04-19 ENCOUNTER — Encounter: Payer: Self-pay | Admitting: Family Medicine

## 2017-04-19 ENCOUNTER — Ambulatory Visit: Payer: BLUE CROSS/BLUE SHIELD | Admitting: Family Medicine

## 2017-04-19 VITALS — BP 126/88 | Temp 98.1°F | Ht 67.0 in | Wt 370.0 lb

## 2017-04-19 DIAGNOSIS — J329 Chronic sinusitis, unspecified: Secondary | ICD-10-CM | POA: Diagnosis not present

## 2017-04-19 MED ORDER — AMOXICILLIN-POT CLAVULANATE 875-125 MG PO TABS
1.0000 | ORAL_TABLET | Freq: Two times a day (BID) | ORAL | 0 refills | Status: DC
Start: 2017-04-19 — End: 2017-05-15

## 2017-04-19 NOTE — Progress Notes (Signed)
   Subjective:    Patient ID: Jose Adkins, male    DOB: [birth year], 42 y.o.   MRN: [id number]  Sinusitis  This is a new problem. Episode onset: 3 days. Associated symptoms include congestion, coughing, ear pain, headaches and a sore throat. Treatments tried: rest, aleve, cold and sinus med.    Sun aftrnoon felt bad with cong headache, nose running and sneezing and pound  This morn moved into the chest   No bad headache  Pos achey in hip and knee    Sig leg pain bilat  Cough  bilat ea pain with swallowing    Energy level not good  Appetite last couple days good ,   Took aleave cold and sinus on couple days     Review of Systems  HENT: Positive for congestion, ear pain and sore throat.   Respiratory: Positive for cough.   Neurological: Positive for headaches.       Objective:   Physical Exam  Alert, mild malaise. Hydration good Vitals stable. frontal/ maxillary tenderness evident positive nasal congestion. pharynx normal neck supple  lungs clear/no crackles or wheezes. heart regular in rhythm       Assessment & Plan:  Impression rhinosinusitis likely post viral, discussed with patient. plan antibiotics prescribed. Questions answered. Symptomatic care discussed. warning signs discussed. WSL

## 2017-05-15 ENCOUNTER — Encounter: Payer: Self-pay | Admitting: Family Medicine

## 2017-05-15 ENCOUNTER — Ambulatory Visit: Payer: BLUE CROSS/BLUE SHIELD | Admitting: Family Medicine

## 2017-05-15 VITALS — BP 122/86 | Temp 98.6°F | Ht 67.0 in | Wt 370.0 lb

## 2017-05-15 DIAGNOSIS — J019 Acute sinusitis, unspecified: Secondary | ICD-10-CM | POA: Diagnosis not present

## 2017-05-15 DIAGNOSIS — B9689 Other specified bacterial agents as the cause of diseases classified elsewhere: Secondary | ICD-10-CM | POA: Diagnosis not present

## 2017-05-15 MED ORDER — AMOXICILLIN-POT CLAVULANATE 875-125 MG PO TABS: 1.0000 | ORAL_TABLET | Freq: Two times a day (BID) | ORAL | 0 refills | Status: DC

## 2017-05-15 NOTE — Progress Notes (Signed)
   Subjective:    Patient ID: Jose Adkins, male    DOB: 01-29-1976, 42 y.o.   MRN: [id number]  Sinusitis  This is a new problem. Episode onset: 2 days. Associated symptoms include congestion, coughing, ear pain, headaches and a sore throat. Pertinent negatives include no chills. (Diarrhea) Treatments tried: sudafed.   Patient with significant head congestion drainage coughing sinus pressure not feeling good relates had a viral-like illness now with secondary sinus PMH benign   Review of Systems  Constitutional: Negative for activity change, chills and fever.  HENT: Positive for congestion, ear pain, rhinorrhea and sore throat.   Eyes: Negative for discharge.  Respiratory: Positive for cough. Negative for wheezing.   Cardiovascular: Negative for chest pain.  Gastrointestinal: Negative for nausea and vomiting.  Musculoskeletal: Negative for arthralgias.  Neurological: Positive for headaches.       Objective:   Physical Exam  Constitutional: He appears well-developed.  HENT:  Head: Normocephalic and atraumatic.  Mouth/Throat: Oropharynx is clear and moist. No oropharyngeal exudate.  Eyes: Right eye exhibits no discharge. Left eye exhibits no discharge.  Neck: Normal range of motion.  Cardiovascular: Normal rate, regular rhythm and normal heart sounds.  No murmur heard. Pulmonary/Chest: Effort normal and breath sounds normal. No respiratory distress. He has no wheezes. He has no rales.  Lymphadenopathy:    He has no cervical adenopathy.  Neurological: He exhibits normal muscle tone.  Skin: Skin is warm and dry.  Nursing note and vitals reviewed.         Assessment & Plan:  Patient was seen today for upper respiratory illness. It is felt that the patient is dealing with sinusitis. Antibiotics were prescribed today. Importance of compliance with medication was discussed. Symptoms should gradually resolve over the course of the next several days. If high fevers, progressive  illness, difficulty breathing, worsening condition or failure for symptoms to improve over the next several days then the patient is to follow-up. If any emergent conditions the patient is to follow-up in the emergency department otherwise to follow-up in the office.  No evidence of pneumonia

## 2017-06-21 ENCOUNTER — Ambulatory Visit: Payer: BLUE CROSS/BLUE SHIELD | Admitting: Family Medicine

## 2017-06-21 ENCOUNTER — Encounter: Payer: Self-pay | Admitting: Family Medicine

## 2017-06-21 VITALS — BP 150/100 | Ht 67.0 in | Wt 377.0 lb

## 2017-06-21 DIAGNOSIS — I10 Essential (primary) hypertension: Secondary | ICD-10-CM | POA: Diagnosis not present

## 2017-06-21 DIAGNOSIS — Z13220 Encounter for screening for lipoid disorders: Secondary | ICD-10-CM | POA: Diagnosis not present

## 2017-06-21 DIAGNOSIS — Z79899 Other long term (current) drug therapy: Secondary | ICD-10-CM | POA: Diagnosis not present

## 2017-06-21 DIAGNOSIS — R739 Hyperglycemia, unspecified: Secondary | ICD-10-CM

## 2017-06-21 DIAGNOSIS — M5431 Sciatica, right side: Secondary | ICD-10-CM

## 2017-06-21 MED ORDER — GABAPENTIN 100 MG PO CAPS: 100.0000 mg | ORAL_CAPSULE | Freq: Three times a day (TID) | ORAL | 3 refills | Status: DC

## 2017-06-21 MED ORDER — PREDNISONE 20 MG PO TABS: ORAL_TABLET | ORAL | 0 refills | Status: DC

## 2017-06-21 MED ORDER — DULOXETINE HCL 30 MG PO CPEP: 30.0000 mg | ORAL_CAPSULE | Freq: Every day | ORAL | 2 refills | Status: DC

## 2017-06-21 MED ORDER — HYDROCODONE-ACETAMINOPHEN 10-325 MG PO TABS: 1.0000 | ORAL_TABLET | ORAL | 0 refills | Status: AC | PRN

## 2017-06-21 NOTE — Progress Notes (Addendum)
   Subjective:    Patient ID: Jose Adkins, male    DOB: 12/22/1975, 42 y.o.   MRN: [id number]  HPI Patient is here today with complaints of sciatica pain down ride side radiates down the hip into the leg. Ongoing for a week and has been using naproxen which has helped.  Patient relates low back pain discomfort radiates down the leg he has had this before but this was very intense been present over the past couple weeks no loss of bowel or bladder control no weakness does relate some burning pain and discomfort and some intermittent numbness into the foot.  He is able to walk on his heels walk on his toes but has difficult time moving around because of the pain Review of Systems  Constitutional: Negative for activity change.  HENT: Negative for congestion and rhinorrhea.   Respiratory: Negative for cough and shortness of breath.   Cardiovascular: Negative for chest pain.  Gastrointestinal: Negative for abdominal pain, diarrhea, nausea and vomiting.  Genitourinary: Negative for dysuria and hematuria.  Musculoskeletal: Positive for back pain and gait problem. Negative for joint swelling.  Neurological: Negative for weakness and headaches.  Psychiatric/Behavioral: Negative for confusion.       Objective:   Physical Exam  Constitutional: He appears well-nourished.  Cardiovascular: Normal rate, regular rhythm and normal heart sounds.  No murmur heard. Pulmonary/Chest: Effort normal and breath sounds normal.  Musculoskeletal: He exhibits no edema.  Lymphadenopathy:    He has no cervical adenopathy.  Neurological: He is alert.  Psychiatric: His behavior is normal.  Vitals reviewed. Positive straight leg raise on the right able to walk on heels able to walk on toes strength overall good        Assessment & Plan:  Sciatica right side prednisone taper pain medicine for intermittent use do not use with nerve medicine anti-inflammatories as needed  Patient needs screening lab work has  not had lab work in a while has high risk of stroke heart attack because of body habitus therefore check cholesterol profile sugar follow-up in 4 to 6 weeks to recheck sciatica as well as this  Start gabapentin to help with sciatica gradually taper this up  Continue blood pressure medicine for high blood pressure  Patient stressed because of loss of his dad 2 years ago  If the patient progressively gets worse or other problems occur follow-up immediately warning signs discussed  Patient denies being suicidal  May need MRI if not improving over the next 6 weeks

## 2017-06-23 ENCOUNTER — Other Ambulatory Visit: Payer: Self-pay | Admitting: Family Medicine

## 2017-06-23 ENCOUNTER — Telehealth: Payer: Self-pay | Admitting: Family Medicine

## 2017-06-23 MED ORDER — CHLORZOXAZONE 500 MG PO TABS: ORAL_TABLET | ORAL | 0 refills | Status: DC

## 2017-06-23 NOTE — Telephone Encounter (Signed)
Patient was seen 4/3 with back pain and states that the gabapentin is not helping and wondering if you can call in chlorzoxazone 500 mg to [facility]

## 2017-06-23 NOTE — Telephone Encounter (Signed)
Chlorzoxazone 500 mg 1 3 times daily as needed discomfort caution drowsiness home use only #21

## 2017-06-23 NOTE — Telephone Encounter (Signed)
Med sent to pharm. Pt notified.  

## 2017-07-06 ENCOUNTER — Other Ambulatory Visit: Payer: Self-pay | Admitting: Family Medicine

## 2017-07-06 NOTE — Telephone Encounter (Signed)
May have 1 refill 

## 2017-08-17 ENCOUNTER — Other Ambulatory Visit: Payer: Self-pay | Admitting: Family Medicine

## 2017-08-19 NOTE — Telephone Encounter (Signed)
1 refill on each needs office visit

## 2017-09-11 ENCOUNTER — Encounter: Payer: Self-pay | Admitting: Family Medicine

## 2017-09-11 ENCOUNTER — Ambulatory Visit: Payer: BLUE CROSS/BLUE SHIELD | Admitting: Family Medicine

## 2017-09-11 VITALS — BP 122/94 | Ht 67.0 in | Wt 377.0 lb

## 2017-09-11 DIAGNOSIS — F322 Major depressive disorder, single episode, severe without psychotic features: Secondary | ICD-10-CM | POA: Diagnosis not present

## 2017-09-11 MED ORDER — FLUOXETINE HCL 20 MG PO TABS: ORAL_TABLET | ORAL | 3 refills | Status: DC

## 2017-09-11 NOTE — Progress Notes (Signed)
   Subjective:    Patient ID: Jose Adkins, male    DOB: 1976/02/14, 42 y.o.   MRN: [id number]  HPI  Patient is here today for a follow up on depression and anxiety.He states it has been getting worse. He is taking Cymbalta 30 mg daily and also on Clonazepam 0.5 1/2 - 1 bid prn. Patient finds himself feeling very depressed Working too many hours Under a lot of stress Recent loss of his father At times he feels overwhelmed and feels like what is worth going on for He does not have a specific aspect of hurting himself He states he would never kill himself but finds himself very stressed Review of Systems     Objective:   Physical Exam   20+ minutes spent with the patient discussing multiple issues.  I reviewed over his PHQ 9 and GAD both of these showed significant positive results Patient is open to counseling as well as consultation with mental health specialist as well as change in medications patient overwhelmed by work I recommended he take a leave of absence      Assessment & Plan:  Major depression with anxiety Patient not suicidal At times he feels like life is not worth living but does not have specific plan and states he would not hurt himself Patient having severe issues that I believe the patient should not continue working currently We will help set up for some counseling  In my opinion this patient needs to come out from work with FMLA for at least the next 4 weeks I will see him back in 1 week in addition to this if he is able to negotiate a much lower stress level at work he may be able to go back to work sooner

## 2017-09-11 NOTE — Progress Notes (Signed)
Urgent referral ordered in Epic. °

## 2017-09-12 ENCOUNTER — Telehealth: Payer: Self-pay | Admitting: Family Medicine

## 2017-09-12 ENCOUNTER — Encounter: Payer: Self-pay | Admitting: Family Medicine

## 2017-09-12 NOTE — Telephone Encounter (Signed)
The patient may have a note stating that he may return to work 8 hours/day starting Monday but not to work over 8 hours/day due to medical condition

## 2017-09-12 NOTE — Telephone Encounter (Signed)
Patient wants to know if Dr. Nicki Reaper can write him a note stating that it is OK for him to work only 8 hours a day next week and any more than that will be on FMLA.

## 2017-09-12 NOTE — Telephone Encounter (Signed)
Work excuse complete, notified patient.

## 2017-09-18 ENCOUNTER — Encounter: Payer: Self-pay | Admitting: Family Medicine

## 2017-09-18 ENCOUNTER — Ambulatory Visit: Payer: BLUE CROSS/BLUE SHIELD | Admitting: Family Medicine

## 2017-09-18 VITALS — BP 138/82 | Ht 67.0 in | Wt 377.0 lb

## 2017-09-18 DIAGNOSIS — F322 Major depressive disorder, single episode, severe without psychotic features: Secondary | ICD-10-CM

## 2017-09-18 NOTE — Progress Notes (Signed)
   Subjective:    Patient ID: Jose Adkins, male    DOB: [birth year], 42 y.o.   MRN: [id number]  HPI  Patient arrives for a one week follow up on depression. 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

## 2017-09-19 DIAGNOSIS — F331 Major depressive disorder, recurrent, moderate: Secondary | ICD-10-CM | POA: Diagnosis not present

## 2017-10-09 ENCOUNTER — Telehealth: Payer: Self-pay | Admitting: Family Medicine

## 2017-10-09 NOTE — Telephone Encounter (Signed)
Pt brought in a form to have filled out for work. He has filled out FMLA form as well. CB# [phone]

## 2017-10-10 ENCOUNTER — Telehealth: Payer: Self-pay | Admitting: Family Medicine

## 2017-10-10 NOTE — Telephone Encounter (Signed)
Patient brought in disability form not FMLA yesterday. After reviewing form it has to be handle by primary care doctor.In your yellow folder.

## 2017-10-11 ENCOUNTER — Other Ambulatory Visit: Payer: Self-pay | Admitting: Family Medicine

## 2017-10-11 DIAGNOSIS — I10 Essential (primary) hypertension: Secondary | ICD-10-CM

## 2017-10-11 DIAGNOSIS — Z13220 Encounter for screening for lipoid disorders: Secondary | ICD-10-CM

## 2017-10-12 DIAGNOSIS — F331 Major depressive disorder, recurrent, moderate: Secondary | ICD-10-CM | POA: Diagnosis not present

## 2017-10-12 NOTE — Telephone Encounter (Signed)
I filled out this patient's form.  When the patient picks it up please have him review it if it needs any amendments or additions please let me know

## 2017-10-17 DIAGNOSIS — Z0289 Encounter for other administrative examinations: Secondary | ICD-10-CM

## 2017-10-20 ENCOUNTER — Other Ambulatory Visit: Payer: Self-pay | Admitting: Family Medicine

## 2017-10-20 NOTE — Telephone Encounter (Signed)
May have this +1 refill 

## 2017-11-09 DIAGNOSIS — F331 Major depressive disorder, recurrent, moderate: Secondary | ICD-10-CM | POA: Diagnosis not present

## 2017-11-13 ENCOUNTER — Ambulatory Visit: Payer: BLUE CROSS/BLUE SHIELD | Admitting: Family Medicine

## 2017-11-13 ENCOUNTER — Encounter: Payer: Self-pay | Admitting: Family Medicine

## 2017-11-13 VITALS — BP 120/90 | Ht 67.0 in | Wt 371.0 lb

## 2017-11-13 DIAGNOSIS — F324 Major depressive disorder, single episode, in partial remission: Secondary | ICD-10-CM | POA: Diagnosis not present

## 2017-11-13 DIAGNOSIS — Z13220 Encounter for screening for lipoid disorders: Secondary | ICD-10-CM | POA: Diagnosis not present

## 2017-11-13 DIAGNOSIS — I10 Essential (primary) hypertension: Secondary | ICD-10-CM | POA: Diagnosis not present

## 2017-11-13 DIAGNOSIS — Z79899 Other long term (current) drug therapy: Secondary | ICD-10-CM

## 2017-11-13 MED ORDER — FLUOXETINE HCL 20 MG PO TABS: ORAL_TABLET | ORAL | 5 refills | Status: DC

## 2017-11-13 NOTE — Progress Notes (Signed)
   Subjective:    Patient ID: Jose Adkins, male    DOB: 1975/07/17, 42 y.o.   MRN: [id number]  HPI  Patient is here today for follow up on chronic health issues. He states he eats healthy and exercises. He sees a counselor,but no other specialists. Moods are doing better He has left the job that was stressing him He takes his medicine on a regular basis states his moods are doing much better Denies any chest tightness pressure pain shortness of breath Blood pressure doing well Compliance with medicine good Review of Systems Denies chest tightness pressure pain shortness of breath denies nausea vomiting diarrhea    Objective:   Physical Exam Lungs are clear respiratory rate normal heart regular no murmurs neck large but no adenopathy No trachea deviation      Assessment & Plan:  Will need to look at cholesterol profile before next visit  Depression partial remission continue counseling for now continue medication when the patient follows up in several months if doing well we will discuss possibly tapering back  Using nerve medicine sporadically not frequently  HTN- Patient was seen today as part of a visit regarding hypertension. The importance of healthy diet and regular physical activity was discussed. The importance of compliance with medications discussed.  Ideal goal is to keep blood pressure low elevated levels certainly below 127/51 when possible.  The patient was counseled that keeping blood pressure under control lessen his risk of complications.  The importance of regular follow-ups was discussed with the patient.  Low-salt diet such as DASH recommended.  Regular physical activity was recommended as well.  Patient was advised to keep regular follow-ups.  Follow-up in 4 to 6 months sooner problems  Encourage patient to try to exercise lose weight

## 2017-11-15 DIAGNOSIS — Z13220 Encounter for screening for lipoid disorders: Secondary | ICD-10-CM | POA: Diagnosis not present

## 2017-11-15 DIAGNOSIS — I10 Essential (primary) hypertension: Secondary | ICD-10-CM | POA: Diagnosis not present

## 2017-11-16 ENCOUNTER — Encounter: Payer: Self-pay | Admitting: Family Medicine

## 2017-11-16 LAB — HEPATIC FUNCTION PANEL
ALBUMIN: 4.4 g/dL (ref 3.5–5.5)
ALK PHOS: 84 IU/L (ref 39–117)
ALT: 32 IU/L (ref 0–44)
AST: 26 IU/L (ref 0–40)
BILIRUBIN, DIRECT: 0.18 mg/dL (ref 0.00–0.40)
Bilirubin Total: 0.6 mg/dL (ref 0.0–1.2)
Total Protein: 6.7 g/dL (ref 6.0–8.5)

## 2017-11-16 LAB — BASIC METABOLIC PANEL
BUN / CREAT RATIO: 9 (ref 9–20)
BUN: 14 mg/dL (ref 6–24)
CO2: 22 mmol/L (ref 20–29)
Calcium: 9.3 mg/dL (ref 8.7–10.2)
Chloride: 102 mmol/L (ref 96–106)
Creatinine, Ser: 1.55 mg/dL — ABNORMAL HIGH (ref 0.76–1.27)
GFR, EST AFRICAN AMERICAN: 63 mL/min/{1.73_m2} (ref >59)
GFR, EST NON AFRICAN AMERICAN: 54 mL/min/{1.73_m2} — AB (ref >59)
Glucose: 97 mg/dL (ref 65–99)
POTASSIUM: 4.5 mmol/L (ref 3.5–5.2)
SODIUM: 137 mmol/L (ref 134–144)

## 2017-11-16 LAB — LIPID PANEL
Chol/HDL Ratio: 4 ratio (ref 0.0–5.0)
Cholesterol, Total: 153 mg/dL (ref 100–199)
HDL: 38 mg/dL — AB (ref >39)
LDL Calculated: 99 mg/dL (ref 0–99)
Triglycerides: 82 mg/dL (ref 0–149)
VLDL CHOLESTEROL CAL: 16 mg/dL (ref 5–40)

## 2017-12-11 ENCOUNTER — Telehealth: Payer: Self-pay | Admitting: Family Medicine

## 2017-12-11 NOTE — Telephone Encounter (Signed)
Patient needs a copy of CPAP titration study to give to pharmacy. Advise.

## 2017-12-11 NOTE — Telephone Encounter (Signed)
Patient came in this morning and given copy of sleep study, patient is calling stating it was the wrong one, needing titration.

## 2017-12-11 NOTE — Telephone Encounter (Signed)
Rx written awaiting signature. 

## 2017-12-11 NOTE — Telephone Encounter (Signed)
Prescription faxed to pharmacy.

## 2017-12-11 NOTE — Telephone Encounter (Signed)
Patient needing a new prescription for C pap mask for his machine called into [place]

## 2017-12-11 NOTE — Telephone Encounter (Signed)
Called & spoke with Danae Chen at [facility] never supplied his CPAP supplies, therefore will need all sleep apnea OV's & testing  Faxed original OV notes from paper chart along with original sleep study, titration study & HST  Filed

## 2017-12-11 NOTE — Telephone Encounter (Signed)
Please go ahead write out on a prescription pad I will sign it and we can send it to [facility]

## 2018-01-01 ENCOUNTER — Ambulatory Visit: Payer: Self-pay | Admitting: Mental Health

## 2018-03-22 ENCOUNTER — Other Ambulatory Visit: Payer: Self-pay | Admitting: Family Medicine

## 2018-03-22 NOTE — Telephone Encounter (Signed)
This is a request for naproxen which is anti-inflammatory Recent studies show that anti-inflammatories with antidepressants increase the risk of GI ulcers Please discussed this with the patient The patient needs to be aware of an increased risk of ulcers with this combination If he chooses to be continuing the anti-inflammatory then he needs to understand that if he starts seeing any black stools tarry stools or bleeding that is a sign of a bleeding ulcer If he decides that he does not want to continue this medicine we will stop it

## 2018-03-23 NOTE — Telephone Encounter (Signed)
Patient understands all and would like this filled. May I fill ?

## 2018-03-23 NOTE — Telephone Encounter (Signed)
May have this +2 refills 

## 2018-04-11 ENCOUNTER — Encounter: Payer: Self-pay | Admitting: Family Medicine

## 2018-04-11 ENCOUNTER — Ambulatory Visit: Payer: BC Managed Care – PPO | Admitting: Family Medicine

## 2018-04-11 VITALS — BP 122/90 | Temp 98.2°F | Ht 67.0 in | Wt 350.0 lb

## 2018-04-11 DIAGNOSIS — J111 Influenza due to unidentified influenza virus with other respiratory manifestations: Secondary | ICD-10-CM

## 2018-04-11 MED ORDER — OSELTAMIVIR PHOSPHATE 75 MG PO CAPS: 75.0000 mg | ORAL_CAPSULE | Freq: Two times a day (BID) | ORAL | 0 refills | Status: AC

## 2018-04-11 MED ORDER — ALBUTEROL SULFATE HFA 108 (90 BASE) MCG/ACT IN AERS
2.0000 | INHALATION_SPRAY | Freq: Four times a day (QID) | RESPIRATORY_TRACT | 2 refills | Status: DC | PRN
Start: 2018-04-11 — End: 2018-05-10

## 2018-04-11 NOTE — Progress Notes (Signed)
   Subjective:    Patient ID: Jose Adkins, male    DOB: 08-09-1975, 43 y.o.   MRN: [id number]  Sinusitis  This is a new problem. Episode onset: 2 days. Associated symptoms include congestion, coughing, ear pain, headaches and a sore throat. Treatments tried: aleve cold and sinus.    Sudden onset lat eve  Burning and tigtnes in chest  Left ear uncomfortable  Headache bad   Cough bad   Feeling chilled   Dim energ   achey in joints  Energy  Appetite not good     Review of Systems  HENT: Positive for congestion, ear pain and sore throat.   Respiratory: Positive for cough.   Neurological: Positive for headaches.       Objective:   Physical Exam  Alert vitals reviewed, moderate malaise. Hydration good. Positive nasal congestion lungs no crackles or wheezes, no tachypnea, intermittent bronchial cough during exam heart regular rate and rhythm.       Assessment & Plan:  Impression influenza discussed at length. Petra Kuba of illness and potential sequela discussed. Plan Tamiflu prescribed if indicated and timing appropriate. Symptom care discussed. Warning signs discussed. WSL

## 2018-05-10 ENCOUNTER — Ambulatory Visit (INDEPENDENT_AMBULATORY_CARE_PROVIDER_SITE_OTHER): Payer: BLUE CROSS/BLUE SHIELD | Admitting: Family Medicine

## 2018-05-10 ENCOUNTER — Encounter: Payer: Self-pay | Admitting: Family Medicine

## 2018-05-10 VITALS — BP 134/86 | Ht 67.0 in | Wt 358.0 lb

## 2018-05-10 DIAGNOSIS — Z0000 Encounter for general adult medical examination without abnormal findings: Secondary | ICD-10-CM | POA: Diagnosis not present

## 2018-05-10 DIAGNOSIS — Z13220 Encounter for screening for lipoid disorders: Secondary | ICD-10-CM

## 2018-05-10 DIAGNOSIS — I10 Essential (primary) hypertension: Secondary | ICD-10-CM | POA: Diagnosis not present

## 2018-05-10 DIAGNOSIS — Z79899 Other long term (current) drug therapy: Secondary | ICD-10-CM | POA: Diagnosis not present

## 2018-05-10 MED ORDER — LISINOPRIL-HYDROCHLOROTHIAZIDE 20-12.5 MG PO TABS: ORAL_TABLET | ORAL | 1 refills | Status: DC

## 2018-05-10 MED ORDER — AMLODIPINE BESYLATE 10 MG PO TABS
10.0000 mg | ORAL_TABLET | Freq: Every day | ORAL | 1 refills | Status: DC
Start: 2018-05-10 — End: 2019-01-07

## 2018-05-10 NOTE — Progress Notes (Signed)
Subjective:    Patient ID: Jose Adkins, male    DOB: [birth year], 43 y.o.   MRN: [id number]  HPI The patient comes in today for a wellness visit.  The patient's BMI is calculated.  The patient does have obesity.  The patient does try to some degree staying active and watching diet.  It is in the vital signs and acknowledged.  It is above the recommended BMI for the patient's height and weight.  The patient has been counseled regarding healthy diet, restricted portions, avoiding excessive carbohydrates/sugary foods, and increase physical activity as health permits.  It is in the patient's best interest to lower the risk of secondary illness including heart disease strokes and cancer by losing weight.  The patient acknowledges this information.    A review of their health history was completed.  A review of medications was also completed.  Any needed refills; Pt will need additonal refills of Naproxen. Pt had Naproxen filled last week but no refills left  Eating habits: health conscious  Falls/  MVA accidents in past few months: none  Regular exercise: walk 30 min daily  Specialist pt sees on regular basis: none  Preventative health issues were discussed.   Additional concerns: pt has form with him that needs to be completed for work.    Review of Systems  Constitutional: Negative for diaphoresis and fatigue.  HENT: Negative for congestion and rhinorrhea.   Respiratory: Negative for cough and shortness of breath.   Cardiovascular: Negative for chest pain and leg swelling.  Gastrointestinal: Negative for abdominal pain and diarrhea.  Skin: Negative for color change and rash.  Neurological: Negative for dizziness and headaches.  Psychiatric/Behavioral: Negative for behavioral problems and confusion.       Objective:   Physical Exam Vitals signs reviewed.  Constitutional:      General: He is not in acute distress. HENT:     Head: Normocephalic and atraumatic.  Eyes:       General:        Right eye: No discharge.        Left eye: No discharge.  Neck:     Trachea: No tracheal deviation.  Cardiovascular:     Rate and Rhythm: Normal rate and regular rhythm.     Heart sounds: Normal heart sounds. No murmur.  Pulmonary:     Effort: Pulmonary effort is normal. No respiratory distress.     Breath sounds: Normal breath sounds.  Lymphadenopathy:     Cervical: No cervical adenopathy.  Skin:    General: Skin is warm and dry.  Neurological:     Mental Status: He is alert.     Coordination: Coordination normal.  Psychiatric:        Behavior: Behavior normal.           Assessment & Plan:  Adult wellness-complete.wellness physical was conducted today. Importance of diet and exercise were discussed in detail.  In addition to this a discussion regarding safety was also covered. We also reviewed over immunizations and gave recommendations regarding current immunization needed for age.  In addition to this additional areas were also touched on including: Preventative health exams needed:  Colonoscopy not indicated currently  Patient was advised yearly wellness exam Lab work ordered await results  Blood pressure good control continue current measures follow-up in 6 months Patient for blood pressure check up.  The patient does have hypertension.  The patient is on medication.  Patient relates compliance with meds. Todays BP reviewed with  the patient. Patient denies issues with medication. Patient relates reasonable diet. Patient tries to minimize salt. Patient aware of BP goals.  Morbid obesity very important for the patient lose weight dietary measures discussed exercise discussed  Moods overall doing well continue current approach not on any antidepressants currently states he has noticed significant improvement with his depression after getting a new job  Medications were refilled as standard for this level of issues he does need every 67-month visits  for this.

## 2018-05-10 NOTE — Patient Instructions (Signed)
DASH Eating Plan  DASH stands for "Dietary Approaches to Stop Hypertension." The DASH eating plan is a healthy eating plan that has been shown to reduce high blood pressure (hypertension). It may also reduce your risk for type 2 diabetes, heart disease, and stroke. The DASH eating plan may also help with weight loss.  What are tips for following this plan?    General guidelines   Avoid eating more than 2,300 mg (milligrams) of salt (sodium) a day. If you have hypertension, you may need to reduce your sodium intake to 1,500 mg a day.   Limit alcohol intake to no more than 1 drink a day for nonpregnant women and 2 drinks a day for men. One drink equals 12 oz of beer, 5 oz of wine, or 1 oz of hard liquor.   Work with your health care provider to maintain a healthy body weight or to lose weight. Ask what an ideal weight is for you.   Get at least 30 minutes of exercise that causes your heart to beat faster (aerobic exercise) most days of the week. Activities may include walking, swimming, or biking.   Work with your health care provider or diet and nutrition specialist (dietitian) to adjust your eating plan to your individual calorie needs.  Reading food labels     Check food labels for the amount of sodium per serving. Choose foods with less than 5 percent of the Daily Value of sodium. Generally, foods with less than 300 mg of sodium per serving fit into this eating plan.   To find whole grains, look for the word "whole" as the first word in the ingredient list.  Shopping   Buy products labeled as "low-sodium" or "no salt added."   Buy fresh foods. Avoid canned foods and premade or frozen meals.  Cooking   Avoid adding salt when cooking. Use salt-free seasonings or herbs instead of table salt or sea salt. Check with your health care provider or pharmacist before using salt substitutes.   Do not fry foods. Cook foods using healthy methods such as baking, boiling, grilling, and broiling instead.   Cook with  heart-healthy oils, such as olive, canola, soybean, or sunflower oil.  Meal planning   Eat a balanced diet that includes:  ? 5 or more servings of fruits and vegetables each day. At each meal, try to fill half of your plate with fruits and vegetables.  ? Up to 6-8 servings of whole grains each day.  ? Less than 6 oz of lean meat, poultry, or fish each day. A 3-oz serving of meat is about the same size as a deck of cards. One egg equals 1 oz.  ? 2 servings of low-fat dairy each day.  ? A serving of nuts, seeds, or beans 5 times each week.  ? Heart-healthy fats. Healthy fats called Omega-3 fatty acids are found in foods such as flaxseeds and coldwater fish, like sardines, salmon, and mackerel.   Limit how much you eat of the following:  ? Canned or prepackaged foods.  ? Food that is high in trans fat, such as fried foods.  ? Food that is high in saturated fat, such as fatty meat.  ? Sweets, desserts, sugary drinks, and other foods with added sugar.  ? Full-fat dairy products.   Do not salt foods before eating.   Try to eat at least 2 vegetarian meals each week.   Eat more home-cooked food and less restaurant, buffet, and fast food.     When eating at a restaurant, ask that your food be prepared with less salt or no salt, if possible.  What foods are recommended?  The items listed may not be a complete list. Talk with your dietitian about what dietary choices are best for you.  Grains  Whole-grain or whole-wheat bread. Whole-grain or whole-wheat pasta. Brown rice. Oatmeal. Quinoa. Bulgur. Whole-grain and low-sodium cereals. Pita bread. Low-fat, low-sodium crackers. Whole-wheat flour tortillas.  Vegetables  Fresh or frozen vegetables (raw, steamed, roasted, or grilled). Low-sodium or reduced-sodium tomato and vegetable juice. Low-sodium or reduced-sodium tomato sauce and tomato paste. Low-sodium or reduced-sodium canned vegetables.  Fruits  All fresh, dried, or frozen fruit. Canned fruit in natural juice (without  added sugar).  Meat and other protein foods  Skinless chicken or turkey. Ground chicken or turkey. Pork with fat trimmed off. Fish and seafood. Egg whites. Dried beans, peas, or lentils. Unsalted nuts, nut butters, and seeds. Unsalted canned beans. Lean cuts of beef with fat trimmed off. Low-sodium, lean deli meat.  Dairy  Low-fat (1%) or fat-free (skim) milk. Fat-free, low-fat, or reduced-fat cheeses. Nonfat, low-sodium ricotta or cottage cheese. Low-fat or nonfat yogurt. Low-fat, low-sodium cheese.  Fats and oils  Soft margarine without trans fats. Vegetable oil. Low-fat, reduced-fat, or light mayonnaise and salad dressings (reduced-sodium). Canola, safflower, olive, soybean, and sunflower oils. Avocado.  Seasoning and other foods  Herbs. Spices. Seasoning mixes without salt. Unsalted popcorn and pretzels. Fat-free sweets.  What foods are not recommended?  The items listed may not be a complete list. Talk with your dietitian about what dietary choices are best for you.  Grains  Baked goods made with fat, such as croissants, muffins, or some breads. Dry pasta or rice meal packs.  Vegetables  Creamed or fried vegetables. Vegetables in a cheese sauce. Regular canned vegetables (not low-sodium or reduced-sodium). Regular canned tomato sauce and paste (not low-sodium or reduced-sodium). Regular tomato and vegetable juice (not low-sodium or reduced-sodium). Pickles. Olives.  Fruits  Canned fruit in a light or heavy syrup. Fried fruit. Fruit in cream or butter sauce.  Meat and other protein foods  Fatty cuts of meat. Ribs. Fried meat. Bacon. Sausage. Bologna and other processed lunch meats. Salami. Fatback. Hotdogs. Bratwurst. Salted nuts and seeds. Canned beans with added salt. Canned or smoked fish. Whole eggs or egg yolks. Chicken or turkey with skin.  Dairy  Whole or 2% milk, cream, and half-and-half. Whole or full-fat cream cheese. Whole-fat or sweetened yogurt. Full-fat cheese. Nondairy creamers. Whipped toppings.  Processed cheese and cheese spreads.  Fats and oils  Butter. Stick margarine. Lard. Shortening. Ghee. Bacon fat. Tropical oils, such as coconut, palm kernel, or palm oil.  Seasoning and other foods  Salted popcorn and pretzels. Onion salt, garlic salt, seasoned salt, table salt, and sea salt. Worcestershire sauce. Tartar sauce. Barbecue sauce. Teriyaki sauce. Soy sauce, including reduced-sodium. Steak sauce. Canned and packaged gravies. Fish sauce. Oyster sauce. Cocktail sauce. Horseradish that you find on the shelf. Ketchup. Mustard. Meat flavorings and tenderizers. Bouillon cubes. Hot sauce and Tabasco sauce. Premade or packaged marinades. Premade or packaged taco seasonings. Relishes. Regular salad dressings.  Where to find more information:   National Heart, Lung, and Blood Institute: www.nhlbi.nih.gov   American Heart Association: www.heart.org  Summary   The DASH eating plan is a healthy eating plan that has been shown to reduce high blood pressure (hypertension). It may also reduce your risk for type 2 diabetes, heart disease, and stroke.   With the   DASH eating plan, you should limit salt (sodium) intake to 2,300 mg a day. If you have hypertension, you may need to reduce your sodium intake to 1,500 mg a day.   When on the DASH eating plan, aim to eat more fresh fruits and vegetables, whole grains, lean proteins, low-fat dairy, and heart-healthy fats.   Work with your health care provider or diet and nutrition specialist (dietitian) to adjust your eating plan to your individual calorie needs.  This information is not intended to replace advice given to you by your health care provider. Make sure you discuss any questions you have with your health care provider.  Document Released: 02/24/2011 Document Revised: 02/29/2016 Document Reviewed: 02/29/2016  [employer]vier Interactive Patient Education  2019 [employer]

## 2018-05-11 ENCOUNTER — Encounter: Payer: Self-pay | Admitting: Family Medicine

## 2018-05-11 LAB — BASIC METABOLIC PANEL
BUN / CREAT RATIO: 12 (ref 9–20)
BUN: 18 mg/dL (ref 6–24)
CO2: 24 mmol/L (ref 20–29)
CREATININE: 1.48 mg/dL — AB (ref 0.76–1.27)
Calcium: 9.5 mg/dL (ref 8.7–10.2)
Chloride: 102 mmol/L (ref 96–106)
GFR calc Af Amer: 66 mL/min/{1.73_m2} (ref >59)
GFR, EST NON AFRICAN AMERICAN: 57 mL/min/{1.73_m2} — AB (ref >59)
Glucose: 83 mg/dL (ref 65–99)
Potassium: 4.8 mmol/L (ref 3.5–5.2)
SODIUM: 141 mmol/L (ref 134–144)

## 2018-05-11 LAB — LIPID PANEL
Chol/HDL Ratio: 3.8 ratio (ref 0.0–5.0)
Cholesterol, Total: 141 mg/dL (ref 100–199)
HDL: 37 mg/dL — ABNORMAL LOW (ref >39)
LDL Calculated: 85 mg/dL (ref 0–99)
Triglycerides: 94 mg/dL (ref 0–149)
VLDL Cholesterol Cal: 19 mg/dL (ref 5–40)

## 2018-05-11 LAB — HEPATIC FUNCTION PANEL
ALBUMIN: 4.6 g/dL (ref 4.0–5.0)
ALK PHOS: 90 IU/L (ref 39–117)
ALT: 24 IU/L (ref 0–44)
AST: 21 IU/L (ref 0–40)
BILIRUBIN, DIRECT: 0.22 mg/dL (ref 0.00–0.40)
Bilirubin Total: 0.8 mg/dL (ref 0.0–1.2)
TOTAL PROTEIN: 6.8 g/dL (ref 6.0–8.5)

## 2018-06-05 ENCOUNTER — Ambulatory Visit: Payer: Self-pay | Admitting: Family Medicine

## 2018-06-05 ENCOUNTER — Ambulatory Visit (INDEPENDENT_AMBULATORY_CARE_PROVIDER_SITE_OTHER): Payer: BLUE CROSS/BLUE SHIELD | Admitting: Family Medicine

## 2018-06-05 ENCOUNTER — Other Ambulatory Visit: Payer: Self-pay

## 2018-06-05 ENCOUNTER — Encounter: Payer: Self-pay | Admitting: Family Medicine

## 2018-06-05 DIAGNOSIS — R05 Cough: Secondary | ICD-10-CM | POA: Diagnosis not present

## 2018-06-05 DIAGNOSIS — R509 Fever, unspecified: Secondary | ICD-10-CM | POA: Diagnosis not present

## 2018-06-05 DIAGNOSIS — R059 Cough, unspecified: Secondary | ICD-10-CM

## 2018-06-05 LAB — POCT INFLUENZA A/B
Influenza A, POC: NEGATIVE
Influenza B, POC: NEGATIVE

## 2018-06-05 NOTE — Progress Notes (Signed)
Subjective:    Patient ID: Jose Adkins, male    DOB: 1976/01/05, 43 y.o.   MRN: [id number]  Cough  This is a new problem. The current episode started today (started this morning at 7:30am). The cough is non-productive (hacking cough). Associated symptoms include chills, rhinorrhea and shortness of breath. Associated symptoms comments: Chest tighness; feels warm but unknown of any fever. Treatments tried: Naproxen.      Review of Systems  Constitutional: Positive for chills.  HENT: Positive for rhinorrhea.   Respiratory: Positive for cough and shortness of breath.          Person Under Monitoring Name: KEISHAUN HAZEL  Location: [place] [address]   Infection Prevention Recommendations for Individuals Confirmed to have, or Being Evaluated for, 2019 Novel Coronavirus (COVID-19) Infection Who Receive Care at Home  Individuals who are confirmed to have, or are being evaluated for, COVID-19 should follow the prevention steps below until a healthcare provider or local or state health department says they can return to normal activities.  Stay home except to get medical care You should restrict activities outside your home, except for getting medical care. Do not go to work, school, or public areas, and do not use public transportation or taxis.  Call ahead before visiting your doctor Before your medical appointment, call the healthcare provider and tell them that you have, or are being evaluated for, COVID-19 infection. This will help the healthcare provider's office take steps to keep other people from getting infected. Ask your healthcare provider to call the local or state health department.  Monitor your symptoms Seek prompt medical attention if your illness is worsening (e.g., difficulty breathing). Before going to your medical appointment, call the healthcare provider and tell them that you have, or are being evaluated for, COVID-19 infection.  Ask your healthcare provider to call the local or state health department.  Wear a facemask You should wear a facemask that covers your nose and mouth when you are in the same room with other people and when you visit a healthcare provider. People who live with or visit you should also wear a facemask while they are in the same room with you.  Separate yourself from other people in your home As much as possible, you should stay in a different room from other people in your home. Also, you should use a separate bathroom, if available.  Avoid sharing household items You should not share dishes, drinking glasses, cups, eating utensils, towels, bedding, or other items with other people in your home. After using these items, you should wash them thoroughly with soap and water.  Cover your coughs and sneezes Cover your mouth and nose with a tissue when you cough or sneeze, or you can cough or sneeze into your sleeve. Throw used tissues in a lined trash can, and immediately wash your hands with soap and water for at least 20 seconds or use an alcohol-based hand rub.  Wash your [facility] your hands often and thoroughly with soap and water for at least 20 seconds. You can use an alcohol-based hand sanitizer if soap and water are not available and if your hands are not visibly dirty. Avoid touching your eyes, nose, and mouth with unwashed hands.   Prevention Steps for Caregivers and Household Members of Individuals Confirmed to have, or Being Evaluated for, COVID-19 Infection Being Cared for in the Home  If you live with, or provide care at home for, a  person confirmed to have, or being evaluated for, COVID-19 infection please follow these guidelines to prevent infection:  Follow healthcare provider's instructions Make sure that you understand and can help the patient follow any healthcare provider instructions for all care.  Provide for the patient's basic needs You should help the  patient with basic needs in the home and provide support for getting groceries, prescriptions, and other personal needs.  Monitor the patient's symptoms If they are getting sicker, call his or her medical provider and tell them that the patient has, or is being evaluated for, COVID-19 infection. This will help the healthcare provider's office take steps to keep other people from getting infected. Ask the healthcare provider to call the local or state health department.  Limit the number of people who have contact with the patient  If possible, have only one caregiver for the patient.  Other household members should stay in another home or place of residence. If this is not possible, they should stay  in another room, or be separated from the patient as much as possible. Use a separate bathroom, if available.  Restrict visitors who do not have an essential need to be in the home.  Keep older adults, very young children, and other sick people away from the patient Keep older adults, very young children, and those who have compromised immune systems or chronic health conditions away from the patient. This includes people with chronic heart, lung, or kidney conditions, diabetes, and cancer.  Ensure good ventilation Make sure that shared spaces in the home have good air flow, such as from an air conditioner or an opened window, weather permitting.  Wash your hands often  Wash your hands often and thoroughly with soap and water for at least 20 seconds. You can use an alcohol based hand sanitizer if soap and water are not available and if your hands are not visibly dirty.  Avoid touching your eyes, nose, and mouth with unwashed hands.  Use disposable paper towels to dry your hands. If not available, use dedicated cloth towels and replace them when they become wet.  Wear a facemask and gloves  Wear a disposable facemask at all times in the room and gloves when you touch or have contact  with the patient's blood, body fluids, and/or secretions or excretions, such as sweat, saliva, sputum, nasal mucus, vomit, urine, or feces.  Ensure the mask fits over your nose and mouth tightly, and do not touch it during use.  Throw out disposable facemasks and gloves after using them. Do not reuse.  Wash your hands immediately after removing your facemask and gloves.  If your personal clothing becomes contaminated, carefully remove clothing and launder. Wash your hands after handling contaminated clothing.  Place all used disposable facemasks, gloves, and other waste in a lined container before disposing them with other household waste.  Remove gloves and wash your hands immediately after handling these items.  Do not share dishes, glasses, or other household items with the patient  Avoid sharing household items. You should not share dishes, drinking glasses, cups, eating utensils, towels, bedding, or other items with a patient who is confirmed to have, or being evaluated for, COVID-19 infection.  After the person uses these items, you should wash them thoroughly with soap and water.  Wash laundry thoroughly  Immediately remove and wash clothes or bedding that have blood, body fluids, and/or secretions or excretions, such as sweat, saliva, sputum, nasal mucus, vomit, urine, or feces, on them.  Wear gloves when handling laundry from the patient.  Read and follow directions on labels of laundry or clothing items and detergent. In general, wash and dry with the warmest temperatures recommended on the label.  Clean all areas the individual has used often  Clean all touchable surfaces, such as counters, tabletops, doorknobs, bathroom fixtures, toilets, phones, keyboards, tablets, and bedside tables, every day. Also, clean any surfaces that may have blood, body fluids, and/or secretions or excretions on them.  Wear gloves when cleaning surfaces the patient has come in contact with.  Use  a diluted bleach solution (e.g., dilute bleach with 1 part bleach and 10 parts water) or a household disinfectant with a label that says EPA-registered for coronaviruses. To make a bleach solution at home, add 1 tablespoon of bleach to 1 quart (4 cups) of water. For a larger supply, add  cup of bleach to 1 gallon (16 cups) of water.  Read labels of cleaning products and follow recommendations provided on product labels. Labels contain instructions for safe and effective use of the cleaning product including precautions you should take when applying the product, such as wearing gloves or eye protection and making sure you have good ventilation during use of the product.  Remove gloves and wash hands immediately after cleaning.  Monitor yourself for signs and symptoms of illness Caregivers and household members are considered close contacts, should monitor their health, and will be asked to limit movement outside of the home to the extent possible. Follow the monitoring steps for close contacts listed on the symptom monitoring form.   ? If you have additional questions, contact your local health department or call the epidemiologist on call at [phone] (available 24/7). ? This guidance is subject to change. For the most up-to-date guidance from [facility], please refer to their website: YouBlogs.pl Objective:   Physical Exam      Person Under Monitoring Name: ATHONY COPPA  Location: [address] [place] [address]   Infection Prevention Recommendations for Individuals Confirmed to have, or Being Evaluated for, 2019 Novel Coronavirus (COVID-19) Infection Who Receive Care at Home  Individuals who are confirmed to have, or are being evaluated for, COVID-19 should follow the prevention steps below until a healthcare provider or local or state health department says they can return to normal activities.  Stay home  except to get medical care You should restrict activities outside your home, except for getting medical care. Do not go to work, school, or public areas, and do not use public transportation or taxis.  Call ahead before visiting your doctor Before your medical appointment, call the healthcare provider and tell them that you have, or are being evaluated for, COVID-19 infection. This will help the healthcare provider's office take steps to keep other people from getting infected. Ask your healthcare provider to call the local or state health department.  Monitor your symptoms Seek prompt medical attention if your illness is worsening (e.g., difficulty breathing). Before going to your medical appointment, call the healthcare provider and tell them that you have, or are being evaluated for, COVID-19 infection. Ask your healthcare provider to call the local or state health department.  Wear a facemask You should wear a facemask that covers your nose and mouth when you are in the same room with other people and when you visit a healthcare provider. People who live with or visit you should also wear a facemask while they are in the same room with you.  Separate yourself  from other people in your home As much as possible, you should stay in a different room from other people in your home. Also, you should use a separate bathroom, if available.  Avoid sharing household items You should not share dishes, drinking glasses, cups, eating utensils, towels, bedding, or other items with other people in your home. After using these items, you should wash them thoroughly with soap and water.  Cover your coughs and sneezes Cover your mouth and nose with a tissue when you cough or sneeze, or you can cough or sneeze into your sleeve. Throw used tissues in a lined trash can, and immediately wash your hands with soap and water for at least 20 seconds or use an alcohol-based hand rub.  Wash your [facility]  your hands often and thoroughly with soap and water for at least 20 seconds. You can use an alcohol-based hand sanitizer if soap and water are not available and if your hands are not visibly dirty. Avoid touching your eyes, nose, and mouth with unwashed hands.   Prevention Steps for Caregivers and Household Members of Individuals Confirmed to have, or Being Evaluated for, COVID-19 Infection Being Cared for in the Home  If you live with, or provide care at home for, a person confirmed to have, or being evaluated for, COVID-19 infection please follow these guidelines to prevent infection:  Follow healthcare provider's instructions Make sure that you understand and can help the patient follow any healthcare provider instructions for all care.  Provide for the patient's basic needs You should help the patient with basic needs in the home and provide support for getting groceries, prescriptions, and other personal needs.  Monitor the patient's symptoms If they are getting sicker, call his or her medical provider and tell them that the patient has, or is being evaluated for, COVID-19 infection. This will help the healthcare provider's office take steps to keep other people from getting infected. Ask the healthcare provider to call the local or state health department.  Limit the number of people who have contact with the patient  If possible, have only one caregiver for the patient.  Other household members should stay in another home or place of residence. If this is not possible, they should stay  in another room, or be separated from the patient as much as possible. Use a separate bathroom, if available.  Restrict visitors who do not have an essential need to be in the home.  Keep older adults, very young children, and other sick people away from the patient Keep older adults, very young children, and those who have compromised immune systems or chronic health conditions away from the  patient. This includes people with chronic heart, lung, or kidney conditions, diabetes, and cancer.  Ensure good ventilation Make sure that shared spaces in the home have good air flow, such as from an air conditioner or an opened window, weather permitting.  Wash your hands often  Wash your hands often and thoroughly with soap and water for at least 20 seconds. You can use an alcohol based hand sanitizer if soap and water are not available and if your hands are not visibly dirty.  Avoid touching your eyes, nose, and mouth with unwashed hands.  Use disposable paper towels to dry your hands. If not available, use dedicated cloth towels and replace them when they become wet.  Wear a facemask and gloves  Wear a disposable facemask at all times in the room and gloves when you touch  or have contact with the patient's blood, body fluids, and/or secretions or excretions, such as sweat, saliva, sputum, nasal mucus, vomit, urine, or feces.  Ensure the mask fits over your nose and mouth tightly, and do not touch it during use.  Throw out disposable facemasks and gloves after using them. Do not reuse.  Wash your hands immediately after removing your facemask and gloves.  If your personal clothing becomes contaminated, carefully remove clothing and launder. Wash your hands after handling contaminated clothing.  Place all used disposable facemasks, gloves, and other waste in a lined container before disposing them with other household waste.  Remove gloves and wash your hands immediately after handling these items.  Do not share dishes, glasses, or other household items with the patient  Avoid sharing household items. You should not share dishes, drinking glasses, cups, eating utensils, towels, bedding, or other items with a patient who is confirmed to have, or being evaluated for, COVID-19 infection.  After the person uses these items, you should wash them thoroughly with soap and water.  Wash  laundry thoroughly  Immediately remove and wash clothes or bedding that have blood, body fluids, and/or secretions or excretions, such as sweat, saliva, sputum, nasal mucus, vomit, urine, or feces, on them.  Wear gloves when handling laundry from the patient.  Read and follow directions on labels of laundry or clothing items and detergent. In general, wash and dry with the warmest temperatures recommended on the label.  Clean all areas the individual has used often  Clean all touchable surfaces, such as counters, tabletops, doorknobs, bathroom fixtures, toilets, phones, keyboards, tablets, and bedside tables, every day. Also, clean any surfaces that may have blood, body fluids, and/or secretions or excretions on them.  Wear gloves when cleaning surfaces the patient has come in contact with.  Use a diluted bleach solution (e.g., dilute bleach with 1 part bleach and 10 parts water) or a household disinfectant with a label that says EPA-registered for coronaviruses. To make a bleach solution at home, add 1 tablespoon of bleach to 1 quart (4 cups) of water. For a larger supply, add  cup of bleach to 1 gallon (16 cups) of water.  Read labels of cleaning products and follow recommendations provided on product labels. Labels contain instructions for safe and effective use of the cleaning product including precautions you should take when applying the product, such as wearing gloves or eye protection and making sure you have good ventilation during use of the product.  Remove gloves and wash hands immediately after cleaning.  Monitor yourself for signs and symptoms of illness Caregivers and household members are considered close contacts, should monitor their health, and will be asked to limit movement outside of the home to the extent possible. Follow the monitoring steps for close contacts listed on the symptom monitoring form.   ? If you have additional questions, contact your local health  department or call the epidemiologist on call at [phone] (available 24/7). ? This guidance is subject to change. For the most up-to-date guidance from [facility], please refer to their website: YouBlogs.pl    Person Under Monitoring Name: GRAINGER MCCARLEY  Location: [address] [place] [address]   Record here the list of visitors to your home since you became ill with respiratory symptoms that led you to consult a health provider:  Visitor Name Date Time In Time Out Did this person come within 6 feet of you? Indicate Y or N Relationship to Person Under  Monitoring Phone number Comments   ___/____/____ __:__ AM/PM __:__ AM/PM       ___/____/____ __:__ AM/PM __:__ AM/PM       ___/____/____ __:__ AM/PM __:__ AM/PM       ___/____/____ __:__ AM/PM __:__ AM/PM       ___/____/____ __:__ AM/PM __:__ AM/PM       ___/____/____ __:__ AM/PM __:__ AM/PM       ___/____/____ __:__ AM/PM __:__ AM/PM       ___/____/____ __:__ AM/PM __:__ AM/PM       ___/____/____ __:__ AM/PM __:__ AM/PM       ___/____/____ __:__ AM/PM __:__ AM/PM       ___/____/____ __:__ AM/PM __:__ AM/PM       ___/____/____ __:__ AM/PM __:__ AM/PM       ___/____/____ __:__ AM/PM __:__ AM/PM       ___/____/____ __:__ AM/PM __:__ AM/PM       Marice Potter, Division of Public Health, Communicable Disease Branch Assessment & Plan:  Impression exam reveals moderate malaise.  Temp 101.6.  Negative flu test.  Patient states he had the flu in January.  Notes substantial shortness of breath.  And cough.  No wheezes or crackles on auscultation.  Long discussion held with the patient.  Though he does not fit strict criteria I think he definitely wants testing for code at 19.  Rationale discussed.  We have written order to proceed to the 10th tomorrow in this regard  Greater than 50% of this 25 minute face to face visit was spent in counseling and discussion  and coordination of care regarding the above diagnosis/diagnosies

## 2018-06-06 ENCOUNTER — Other Ambulatory Visit: Payer: Self-pay

## 2018-06-06 ENCOUNTER — Telehealth: Payer: Self-pay

## 2018-06-06 DIAGNOSIS — R6889 Other general symptoms and signs: Secondary | ICD-10-CM

## 2018-06-08 ENCOUNTER — Telehealth: Payer: Self-pay | Admitting: *Deleted

## 2018-06-08 NOTE — Telephone Encounter (Signed)
Patient called and stated he was seen Tues pm for fever, cough and SOB- flu test negative was sent for a covid test that was collected wed and is in quarantine. Patient states he is still having significant fevers and SOB- not worse than seen on Tuesday but no better- results of covid test are not back and he was wondering what was advised

## 2018-06-08 NOTE — Telephone Encounter (Signed)
Patient advised per Dr Nicki Reaper: Needless to say at this point supportive measures is the best path if the patient starts spiking fevers or if having increased shortness of breath he needs to go to the ER plain and simple. Patient verbalized understanding.

## 2018-06-08 NOTE — Telephone Encounter (Signed)
Needless to say at this point supportive measures is the best path if the patient starts spiking fevers or if having increased shortness of breath he needs to go to the ER plain and simple

## 2018-06-11 ENCOUNTER — Telehealth: Payer: Self-pay

## 2018-06-11 ENCOUNTER — Telehealth: Payer: Self-pay | Admitting: Family Medicine

## 2018-06-11 NOTE — Telephone Encounter (Signed)
I spoke with Dr. Richardson Landry he states to make the patient aware the test was checked on over the weekend and today the test so far is negative. If he has shortness of breath and feels in distress he needs to go to the hospital, other wise he needs to shelter in place until he hears different. If anything changes with the testing we will notify him. Patient states understanding.    Patient called and states he is still waiting on his Covid 52 test results.  He says he still feels "rough", He still has a cough and some shortness of breath and low grade fever of 99.5. He has been using his Cpap machine to help with breathing. He has been taking Tylenol to help.

## 2018-06-11 NOTE — Telephone Encounter (Signed)
Advised pt again to go to ED if having sob. Pt verbalized understanding. States his father in law is not having any cough, fever, or sob. He is in ICU due to a car accident. Called testing site pt went to in [place] on [address] to get status of results.

## 2018-06-11 NOTE — Telephone Encounter (Signed)
thx

## 2018-06-11 NOTE — Telephone Encounter (Signed)
Patient calling back to check on test results because he is still having a fever, cough, and some shortness of breath, pretty much all the same symptoms as last week.

## 2018-06-11 NOTE — Telephone Encounter (Signed)
Seen last Tuesday. Did covid 19 test on Wednesday. Pt father in law is in ICU at [facility] and they want to know what pt's test results are. Pt is feeling about the same not worse, no better, fever 99.7. cough, sob using cpap machine to help with sob. Most of the time on cpap if not in the bathroom. States he is using it about 95 % of the time. Congestion head and chest full, coughing up yellow phelym. Rotating tylenol and advil every 2 hours.   [facility]

## 2018-06-11 NOTE — Telephone Encounter (Signed)
1.  Please try to track down where the results of this test might be.  You may need to call [facility]-the best I know the drive-through centers sent the testing through [place]? 2.  Please let the patient know that the results have not been posted  #3 if the patient is so short of breath that he is having to use BiPAP even when he is sitting he should strongly consider going to the ER as previously recommended

## 2018-06-11 NOTE — Telephone Encounter (Signed)
See below and also finally got in touch with [facility] and was told test is still in process and was told should have final by tomorrow. Most test take 6 days to get results. Today is the 6th day. Pt was notified that test should be final tomorrow per [facility].

## 2018-06-12 ENCOUNTER — Telehealth: Payer: Self-pay

## 2018-06-12 ENCOUNTER — Encounter: Payer: Self-pay | Admitting: Family Medicine

## 2018-06-12 LAB — NOVEL CORONAVIRUS, NAA: SARS-CoV-2, NAA: NOT DETECTED

## 2018-06-12 NOTE — Telephone Encounter (Signed)
Please go ahead and give him a work note for the requested days

## 2018-06-12 NOTE — Telephone Encounter (Signed)
Patient would like work note from 06/06/18 through 06/12/18 to return to work tomorrow. Advise.

## 2018-06-12 NOTE — Telephone Encounter (Signed)
Please let the patient know that his test was negative. We did check with [place] yesterday with them and at that time it was not back due to delays they were having from the large number of tests that have been sent to [facility] from all parts of [place] (If he feels he needs to be seen again we can certainly see him)(if he is having severe shortness of breath I recommend ER as per previous messages)

## 2018-06-12 NOTE — Telephone Encounter (Signed)
Please give to patient thanks.

## 2018-06-12 NOTE — Telephone Encounter (Signed)
I spoke with the patient and he is aware of the test results being negative. I asked if he was having any problems currently and he states he feels pretty good this am and does not feel he needs to be seen.

## 2018-06-12 NOTE — Telephone Encounter (Signed)
Please advise 

## 2018-06-12 NOTE — Telephone Encounter (Signed)
Patient called in this am for the results of the Covid 19 results.  I advised at that time that the test results were not yet back and advised that we would continue to check on the results throought the day. That I was sorry, but as soon as we got the results we would notify him.  Patient became upset stating "I don't think you are sorry this is serious, my family and I are sitting around waiting on these results".  He states he has heard that other people have been tested and they already had their results back. I advise to the patient that I understood and I would call the lab to see if I could get more information as to how much longer we are to expect the results.  I called [facility] and got the results sent over and the test was negative. The results were sent over to [place] for the Dr's review.

## 2018-07-08 NOTE — Telephone Encounter (Signed)
error 

## 2018-08-01 ENCOUNTER — Emergency Department (HOSPITAL_COMMUNITY): Payer: BLUE CROSS/BLUE SHIELD

## 2018-08-01 ENCOUNTER — Emergency Department (HOSPITAL_COMMUNITY)
Admission: EM | Admit: 2018-08-01 | Discharge: 2018-08-01 | Disposition: A | Discharge status: Home / Self Care | Payer: BLUE CROSS/BLUE SHIELD | Attending: Emergency Medicine | Admitting: Emergency Medicine

## 2018-08-01 DIAGNOSIS — R42 Dizziness and giddiness: Secondary | ICD-10-CM | POA: Diagnosis not present

## 2018-08-01 DIAGNOSIS — Z79899 Other long term (current) drug therapy: Secondary | ICD-10-CM | POA: Diagnosis not present

## 2018-08-01 DIAGNOSIS — R202 Paresthesia of skin: Secondary | ICD-10-CM | POA: Insufficient documentation

## 2018-08-01 DIAGNOSIS — I10 Essential (primary) hypertension: Secondary | ICD-10-CM | POA: Diagnosis not present

## 2018-08-01 LAB — URINALYSIS, ROUTINE W REFLEX MICROSCOPIC
Bilirubin Urine: NEGATIVE
Glucose, UA: NEGATIVE mg/dL
Hgb urine dipstick: NEGATIVE
Ketones, ur: NEGATIVE mg/dL
Leukocytes,Ua: NEGATIVE
Nitrite: NEGATIVE
Protein, ur: NEGATIVE mg/dL
Specific Gravity, Urine: 1.025 (ref 1.005–1.030)
pH: 5 (ref 5.0–8.0)

## 2018-08-01 LAB — DIFFERENTIAL
Abs Immature Granulocytes: 0.02 10*3/uL (ref 0.00–0.07)
Basophils Absolute: 0.1 10*3/uL (ref 0.0–0.1)
Basophils Relative: 1 %
Eosinophils Absolute: 0.1 10*3/uL (ref 0.0–0.5)
Eosinophils Relative: 1 %
Immature Granulocytes: 0 %
Lymphocytes Relative: 28 %
Lymphs Abs: 1.8 10*3/uL (ref 0.7–4.0)
Monocytes Absolute: 0.5 10*3/uL (ref 0.1–1.0)
Monocytes Relative: 8 %
Neutro Abs: 4.1 10*3/uL (ref 1.7–7.7)
Neutrophils Relative %: 62 %

## 2018-08-01 LAB — COMPREHENSIVE METABOLIC PANEL
ALT: 27 U/L (ref 0–44)
AST: 26 U/L (ref 15–41)
Albumin: 4.2 g/dL (ref 3.5–5.0)
Alkaline Phosphatase: 79 U/L (ref 38–126)
Anion gap: 12 (ref 5–15)
BUN: 20 mg/dL (ref 6–20)
CO2: 25 mmol/L (ref 22–32)
Calcium: 8.8 mg/dL — ABNORMAL LOW (ref 8.9–10.3)
Chloride: 102 mmol/L (ref 98–111)
Creatinine, Ser: 1.46 mg/dL — ABNORMAL HIGH (ref 0.61–1.24)
GFR calc Af Amer: >60 mL/min (ref >60)
GFR calc non Af Amer: 58 mL/min — ABNORMAL LOW (ref >60)
Glucose, Bld: 92 mg/dL (ref 70–99)
Potassium: 3.6 mmol/L (ref 3.5–5.1)
Sodium: 139 mmol/L (ref 135–145)
Total Bilirubin: 0.9 mg/dL (ref 0.3–1.2)
Total Protein: 7 g/dL (ref 6.5–8.1)

## 2018-08-01 LAB — CBC
HCT: 46.9 % (ref 39.0–52.0)
Hemoglobin: 15.1 g/dL (ref 13.0–17.0)
MCH: 28.4 pg (ref 26.0–34.0)
MCHC: 32.2 g/dL (ref 30.0–36.0)
MCV: 88.2 fL (ref 80.0–100.0)
Platelets: 195 10*3/uL (ref 150–400)
RBC: 5.32 MIL/uL (ref 4.22–5.81)
RDW: 14 % (ref 11.5–15.5)
WBC: 6.6 10*3/uL (ref 4.0–10.5)
nRBC: 0 % (ref 0.0–0.2)

## 2018-08-01 LAB — RAPID URINE DRUG SCREEN, HOSP PERFORMED
Amphetamines: NOT DETECTED
Barbiturates: NOT DETECTED
Benzodiazepines: NOT DETECTED
Cocaine: NOT DETECTED
Opiates: NOT DETECTED
Tetrahydrocannabinol: NOT DETECTED

## 2018-08-01 LAB — PROTIME-INR
INR: 1.1 (ref 0.8–1.2)
Prothrombin Time: 14.1 seconds (ref 11.4–15.2)

## 2018-08-01 LAB — CBG MONITORING, ED: Glucose-Capillary: 84 mg/dL (ref 70–99)

## 2018-08-01 LAB — ETHANOL: Alcohol, Ethyl (B): <10 mg/dL (ref <10)

## 2018-08-01 LAB — TROPONIN I
Troponin I: <0.03 ng/mL (ref <0.03)
Troponin I: <0.03 ng/mL (ref <0.03)

## 2018-08-01 LAB — APTT: aPTT: 30 seconds (ref 24–36)

## 2018-08-01 MED ORDER — SODIUM CHLORIDE 0.9 % IV BOLUS
1000.0000 mL | Freq: Once | INTRAVENOUS | Status: AC
  Administered 2018-08-01: 1000 mL via INTRAVENOUS

## 2018-08-01 NOTE — ED Notes (Signed)
ED Provider at bedside. 

## 2018-08-01 NOTE — ED Notes (Addendum)
Med Necessity charted in error

## 2018-08-01 NOTE — ED Notes (Signed)
Code Stroke 2979

## 2018-08-01 NOTE — ED Provider Notes (Signed)
[facility] EMERGENCY DEPARTMENT Provider Note   CSN: [id number] Arrival date & time: 08/01/18  1135    History   Chief Complaint Chief Complaint  Patient presents with   Near Syncope    HPI Jose Adkins is a 43 y.o. male.      Near Syncope   Pt was seen at 1150.  Per pt, c/o sudden onset and persistence of constant LUE "numbness" that began approximately 1030 this morning PTA. Pt states he also felt "lightheaded" and "clammy."  Denies fevers, cough, known COVID+ exposures. Denies CP/palpitations, no SOB, no back pain, no abd pain, no N/V/D, no rash, no focal motor weakness.    Past Medical History:  Diagnosis Date   Anxiety    Depression    Hypertension    Kidney stone    Kidney stones    Low testosterone    Reflux    Sleep apnea     Patient Active Problem List   Diagnosis Date Noted   HTN (hypertension) 02/23/2015   Lumbar pain 11/06/2013   Plantar fasciitis of right foot 05/13/2013   Family history of melanoma 05/13/2013   Kidney stones 10/18/2012   Obstructive sleep apnea 10/18/2012   Insomnia 10/18/2012   Generalized anxiety disorder 10/18/2012   Morbid obesity ([place]) 10/18/2012    Past Surgical History:  Procedure Laterality Date   CYSTOSCOPY WITH RETROGRADE PYELOGRAM, URETEROSCOPY AND STENT PLACEMENT Left 09/09/2016   Procedure: CYSTOSCOPY WITH RETROGRADE PYELOGRAM, URETEROSCOPY AND STENT PLACEMENT, FIRST STAGE;  Surgeon: Alexis Frock, MD;  Location: [facility];  Service: Urology;  Laterality: Left;   CYSTOSCOPY WITH RETROGRADE PYELOGRAM, URETEROSCOPY AND STENT PLACEMENT Left 09/30/2016   Procedure: 2ND STAGE CYSTOSCOPY WITH RETROGRADE PYELOGRAM, URETEROSCOPY AND STENT EXCHANGE;  Surgeon: Alexis Frock, MD;  Location: [facility];  Service: Urology;  Laterality: Left;   HOLMIUM LASER APPLICATION Left 06/27/8117   Procedure: HOLMIUM LASER APPLICATION;  Surgeon: Alexis Frock, MD;  Location: [facility];  Service: Urology;  Laterality: Left;    HOLMIUM LASER APPLICATION Left 1/47/8295   Procedure: HOLMIUM LASER APPLICATION;  Surgeon: Alexis Frock, MD;  Location: [facility];  Service: Urology;  Laterality: Left;   VASECTOMY  2002        Home Medications    Prior to Admission medications   Medication Sig Start Date End Date Taking? Authorizing Provider  amLODipine (NORVASC) 10 MG tablet Take 1 tablet (10 mg total) by mouth daily. 05/10/18  Yes Luking, Elayne Snare, MD  chlorzoxazone (PARAFON) 500 MG tablet Take one 3 times daily as needed for discomfort. CAUTION;DROWSINESS (HOME USE ONLY) 06/23/17  Yes Luking, Elayne Snare, MD  clonazePAM (KLONOPIN) 0.5 MG tablet TAKE 1/2 TO 1 (ONE-HALF TO ONE) TABLET BY MOUTH TWICE DAILY AS NEEDED FOR ANXIETY USE  SPARINGLY 10/20/17  Yes Luking, Scott A, MD  diphenhydrAMINE (BENADRYL) 25 MG tablet Take 25 mg by mouth daily as needed for allergies.   Yes [provider]  lisinopril-hydrochlorothiazide (PRINZIDE,ZESTORETIC) 20-12.5 MG tablet TAKE 1 TABLET BY MOUTH ONCE DAILY **CHANGE  IN  DOSE** 05/10/18  Yes Luking, Elayne Snare, MD  naproxen (NAPROSYN) 500 MG tablet TAKE 1 TABLET BY MOUTH TWICE DAILY WITH MEALS **PATIENT  NEEDS  OFFICE  VISIT** 03/23/18  Yes Kathyrn Drown, MD    Family History No family history on file.  Social History Social History   Tobacco Use   Smoking status: Never Smoker   Smokeless tobacco: Never Used  Substance Use Topics   Alcohol use: No   Drug  use: No     Allergies   Patient has no known allergies.   Review of Systems Review of Systems  Cardiovascular: Positive for near-syncope.  ROS: Statement: All systems negative except as marked or noted in the HPI; Constitutional: Negative for fever and chills. ; ; Eyes: Negative for eye pain, redness and discharge. ; ; ENMT: Negative for ear pain, hoarseness, nasal congestion, sinus pressure and sore throat. ; ; Cardiovascular: Negative for chest pain, palpitations, dyspnea and peripheral edema. ; ; Respiratory:  Negative for cough, wheezing and stridor. ; ; Gastrointestinal: Negative for nausea, vomiting, diarrhea, abdominal pain, blood in stool, hematemesis, jaundice and rectal bleeding. . ; ; Genitourinary: Negative for dysuria, flank pain and hematuria. ; ; Musculoskeletal: Negative for back pain and neck pain. Negative for swelling and trauma.; ; Skin: +"clammy."  Negative for pruritus, rash, abrasions, blisters, bruising and skin lesion.; ; Neuro: +paresthesias, lightheadedness. Negative for headache and neck stiffness. Negative for weakness, altered level of consciousness, altered mental status, extremity weakness, involuntary movement, seizure and syncope.      Physical Exam Updated Vital Signs BP 113/84    Pulse 76    Temp 97.6 F (36.4 C) (Oral)    Resp 12    SpO2 100%    Patient Vitals for the past 24 hrs:  BP Temp Temp src Pulse Resp SpO2  08/01/18 1500 113/84 -- -- 76 12 100 %  08/01/18 1443 108/74 -- -- 62 11 98 %  08/01/18 1400 100/62 -- -- 61 12 100 %  08/01/18 1300 110/74 -- -- 69 12 100 %  08/01/18 1257 109/74 -- -- 68 15 100 %  08/01/18 1144 114/77 97.6 F (36.4 C) Oral 74 (!) 22 95 %     13:53 Orthostatic Vital Signs TH  Orthostatic Lying   BP- Lying: 97/67  Pulse- Lying: 63      Orthostatic Sitting  BP- Sitting: 113/90  Pulse- Sitting: 72      Orthostatic Standing at 0 minutes  BP- Standing at 0 minutes: 100/62  Pulse- Standing at 0 minutes: 77     Physical Exam 1155: Physical examination:  Nursing notes reviewed; Vital signs and O2 SAT reviewed;  Constitutional: Well developed, Well nourished, Well hydrated, In no acute distress; Head:  Normocephalic, atraumatic; Eyes: EOMI, PERRL, No scleral icterus; ENMT: Mouth and pharynx normal, Mucous membranes moist; Neck: Supple, Full range of motion, No lymphadenopathy; Cardiovascular: Regular rate and rhythm, No gallop; Respiratory: Breath sounds clear & equal bilaterally, No wheezes.  Speaking full sentences with ease,  Normal respiratory effort/excursion; Chest: Nontender, Movement normal; Abdomen: Soft, Nontender, Nondistended, Normal bowel sounds; Genitourinary: No CVA tenderness; Extremities: Peripheral pulses normal, No tenderness, No edema, No calf edema or asymmetry.; Neuro: AA&Ox3, Major CN grossly intact. No facial droop. Speech clear. No gross focal motor or sensory deficits in extremities.; Skin: Color normal, Warm, Dry.   ED Treatments / Results  Labs (all labs ordered are listed, but only abnormal results are displayed)   EKG EKG Interpretation  Date/Time:  Wednesday Aug 01 2018 11:42:36 EDT Ventricular Rate:  69 PR Interval:    QRS Duration: 98 QT Interval:  412 QTC Calculation: 442 R Axis:   -20 Text Interpretation:  Sinus rhythm Borderline left axis deviation Abnormal R-wave progression, early transition Baseline wander Artifact When compared with ECG of 09/05/2016 No significant change was found Confirmed by Francine Graven [phone]) on 08/01/2018 12:09:48 PM   Radiology   Procedures Procedures (including critical care time)  Medications  Ordered in ED Medications  sodium chloride 0.9 % bolus 1,000 mL (1,000 mLs Intravenous New Bag/Given 08/01/18 1439)     Initial Impression / Assessment and Plan / ED Course  I have reviewed the triage vital signs and the nursing notes.  Pertinent labs & imaging results that were available during my care of the patient were reviewed by me and considered in my medical decision making (see chart for details).     MDM Reviewed: previous chart, nursing note and vitals Reviewed previous: labs and ECG Interpretation: labs, ECG, MRI and CT scan Total time providing critical care: 30-74 minutes. This excludes time spent performing separately reportable procedures and services. Consults: neurology    CRITICAL CARE Performed by: Francine Graven Total critical care time: 35 minutes Critical care time was exclusive of separately billable  procedures and treating other patients. Critical care was necessary to treat or prevent imminent or life-threatening deterioration. Critical care was time spent personally by me on the following activities: development of treatment plan with patient and/or surrogate as well as nursing, discussions with consultants, evaluation of patient's response to treatment, examination of patient, obtaining history from patient or surrogate, ordering and performing treatments and interventions, ordering and review of laboratory studies, ordering and review of radiographic studies, pulse oximetry and re-evaluation of patient's condition.   Results for orders placed or performed during the hospital encounter of 08/01/18  Ethanol  Result Value Ref Range   Alcohol, Ethyl (B) <10 <10 mg/dL  Protime-INR  Result Value Ref Range   Prothrombin Time 14.1 11.4 - 15.2 seconds   INR 1.1 0.8 - 1.2  APTT  Result Value Ref Range   aPTT 30 24 - 36 seconds  CBC  Result Value Ref Range   WBC 6.6 4.0 - 10.5 K/uL   RBC 5.32 4.22 - 5.81 MIL/uL   Hemoglobin 15.1 13.0 - 17.0 g/dL   HCT 46.9 39.0 - 52.0 %   MCV 88.2 80.0 - 100.0 fL   MCH 28.4 26.0 - 34.0 pg   MCHC 32.2 30.0 - 36.0 g/dL   RDW 14.0 11.5 - 15.5 %   Platelets 195 150 - 400 K/uL   nRBC 0.0 0.0 - 0.2 %  Differential  Result Value Ref Range   Neutrophils Relative % 62 %   Neutro Abs 4.1 1.7 - 7.7 K/uL   Lymphocytes Relative 28 %   Lymphs Abs 1.8 0.7 - 4.0 K/uL   Monocytes Relative 8 %   Monocytes Absolute 0.5 0.1 - 1.0 K/uL   Eosinophils Relative 1 %   Eosinophils Absolute 0.1 0.0 - 0.5 K/uL   Basophils Relative 1 %   Basophils Absolute 0.1 0.0 - 0.1 K/uL   Immature Granulocytes 0 %   Abs Immature Granulocytes 0.02 0.00 - 0.07 K/uL  Comprehensive metabolic panel  Result Value Ref Range   Sodium 139 135 - 145 mmol/L   Potassium 3.6 3.5 - 5.1 mmol/L   Chloride 102 98 - 111 mmol/L   CO2 25 22 - 32 mmol/L   Glucose, Bld 92 70 - 99 mg/dL   BUN 20 6 -  20 mg/dL   Creatinine, Ser 1.46 (H) 0.61 - 1.24 mg/dL   Calcium 8.8 (L) 8.9 - 10.3 mg/dL   Total Protein 7.0 6.5 - 8.1 g/dL   Albumin 4.2 3.5 - 5.0 g/dL   AST 26 15 - 41 U/L   ALT 27 0 - 44 U/L   Alkaline Phosphatase 79 38 - 126 U/L  Total Bilirubin 0.9 0.3 - 1.2 mg/dL   GFR calc non Af Amer 58 (L) >60 mL/min   GFR calc Af Amer >60 >60 mL/min   Anion gap 12 5 - 15  Urine rapid drug screen (hosp performed)not at [facility]  Result Value Ref Range   Opiates NONE DETECTED NONE DETECTED   Cocaine NONE DETECTED NONE DETECTED   Benzodiazepines NONE DETECTED NONE DETECTED   Amphetamines NONE DETECTED NONE DETECTED   Tetrahydrocannabinol NONE DETECTED NONE DETECTED   Barbiturates NONE DETECTED NONE DETECTED  Urinalysis, Routine w reflex microscopic  Result Value Ref Range   Color, Urine YELLOW YELLOW   APPearance CLEAR CLEAR   Specific Gravity, Urine 1.025 1.005 - 1.030   pH 5.0 5.0 - 8.0   Glucose, UA NEGATIVE NEGATIVE mg/dL   Hgb urine dipstick NEGATIVE NEGATIVE   Bilirubin Urine NEGATIVE NEGATIVE   Ketones, ur NEGATIVE NEGATIVE mg/dL   Protein, ur NEGATIVE NEGATIVE mg/dL   Nitrite NEGATIVE NEGATIVE   Leukocytes,Ua NEGATIVE NEGATIVE  Troponin I - ONCE - STAT  Result Value Ref Range   Troponin I <0.03 <0.03 ng/mL  CBG monitoring, ED  Result Value Ref Range   Glucose-Capillary 84 70 - 99 mg/dL   Mr Brain Wo Contrast (neuro Protocol) Result Date: 08/01/2018 CLINICAL DATA:  Sudden onset of chest pain which was then accompanied by LEFT arm numbness. Chest pain is improved. EXAM: MRI HEAD WITHOUT CONTRAST TECHNIQUE: Multiplanar, multiecho pulse sequences of the brain and surrounding structures were obtained without intravenous contrast. COMPARISON:  08/01/2018 CT head code stroke was negative. FINDINGS: Brain: No evidence for acute infarction, hemorrhage, mass lesion, hydrocephalus, or extra-axial fluid. Normal cerebral volume. No white matter disease. Vascular: Flow voids are maintained.  No  chronic hemorrhage. Skull and upper cervical spine: Normal marrow signal. Mild tonsillar ectopia, but no frank Chiari I malformation. Sinuses/Orbits: Maxillary sinus retention cysts, greater on the RIGHT. No layering fluid. Negative orbits. Other: None. IMPRESSION: Negative exam. No acute or focal intracranial abnormality. No cause is seen for the reported symptoms. Electronically Signed   By: Staci Righter M.D.   On: 08/01/2018 13:13   Ct Head Code Stroke Wo Contrast Result Date: 08/01/2018 CLINICAL DATA:  Code stroke. LEFT-sided numbness and dizziness since earlier today. EXAM: CT HEAD WITHOUT CONTRAST TECHNIQUE: Contiguous axial images were obtained from the base of the skull through the vertex without intravenous contrast. COMPARISON:  09/16/2011. FINDINGS: Brain: No evidence for acute infarction, hemorrhage, mass lesion, hydrocephalus, or extra-axial fluid. Normal cerebral volume. No white matter disease. Vascular: No hyperdense vessel or unexpected calcification. Skull: Normal. Negative for fracture or focal lesion. Sinuses/Orbits: No acute finding. Other: None. ASPECTS [facility] Stroke Program Early CT Score) - Ganglionic level infarction (caudate, lentiform nuclei, internal capsule, insula, M1-M3 cortex): 7 - Supraganglionic infarction (M4-M6 cortex): 3 Total score (0-10 with 10 being normal): 10 IMPRESSION: 1. Negative exam. 2. ASPECTS is 10. These results were called by telephone at the time of interpretation on 08/01/2018 at 12:02 pm to Dr. Francine Graven , who verbally acknowledged these results. * Electronically Signed   By: Staci Righter M.D.   On: 08/01/2018 12:06    Results for JEFF, FRIEDEN (MRN [id number]) as of 08/01/2018 15:27  Ref. Range 08/19/2016 05:27 09/30/2016 13:10 11/15/2017 08:55 05/10/2018 09:42 08/01/2018 11:55  BUN Latest Ref Range: 6 - 20 mg/dL 21 (H) 23 (H) 14 18 20   Creatinine Latest Ref Range: 0.61 - 1.24 mg/dL 1.50 (H) 1.48 (H) 1.55 (H) 1.48 (H) 1.46 (  H)    1210:  Code  Stroke called on arrival. Tele Neuro Dr. Roanna Raider has evaluated pt: states pt's symptoms are inconsistent on repeat exams, not a TPA candidate, obtain MRI brain and if negative pt can be d/c home.   1525:  Pt states he is "doing somewhat better" regarding his LUE paresthesias. BUN/Cr per baseline. IVF given for mild orthostasis during VS. Pending 2nd troponin and repeat EKG. Sign out to Dr. Wilson Singer.      Final Clinical Impressions(s) / ED Diagnoses   Final diagnoses:  None    ED Discharge Orders    None       Francine Graven, DO 08/01/18 1533

## 2018-08-01 NOTE — ED Notes (Signed)
Pt states he did NOT have chest pain. He only has left arm numbness and dizzines

## 2018-08-01 NOTE — ED Notes (Signed)
Teleneuro on screen

## 2018-08-01 NOTE — ED Triage Notes (Signed)
Pt was at work today and got a sudden onset of chest pain. Broke out in a sweat and got clammy and dizzy. Left arm numbness when chest pain started. No more chest pain, but still has a bit of numbness in left arm. NSR on the monitor. No deficits or weakness noted. CBG 113.

## 2018-08-01 NOTE — ED Notes (Signed)
Pt back from CT

## 2018-08-01 NOTE — Progress Notes (Signed)
CODE STROKE 1139 CALL TIME 1140 BEEPER TIME 1144 EXAM STARTED 1147 EXAM FINISHED 1147 IMAGES SENT TO SOC 1149 EXAM COMPLETED IN EPIC 1149 [place] [facility]

## 2018-08-01 NOTE — Consult Note (Signed)
[facility]rology Consult Services   Date of Service:   08/01/2018 11:52:06  Impression:     .  Rule Out Acute Ischemic Stroke     .  vs psychogenic/anxiety related (most likely) vs complex migraine  Comments/Sign-Out: 43yo M w pmh of htn p/w acute onset L arm numbness and clammy sensation as well as near syncope. Inconsistent sensory deficits within the face (v1 vs v2 and v3 switching sides) and RHS in lower leg vs higher in the thigh vs L HS in the arm. Not candidate for TPA given no disabling stroke like deficits.  Metrics: Last Known Well: 08/01/2018 10:40:00 [facility] Notification Time: 08/01/2018 11:52:06 Arrival Time: 08/01/2018 11:35:00 Stamp Time: 08/01/2018 11:52:06 Time First Login Attempt: 08/01/2018 11:57:53 Video Start Time: 08/01/2018 11:57:53  Symptoms: L arm numbness NIHSS Start Assessment Time: 08/01/2018 11:59:00 Patient is not a candidate for tPA. Patient was not deemed candidate for tPA thrombolytics because of Resolved symptoms (no residual disabling symptoms). Video End Time: 08/01/2018 12:10:37  CT head showed no acute hemorrhage or acute core infarct.  Clinical Presentation is not Suggestive of Large Vessel Occlusive Disease  ED Physician notified of diagnostic impression and management plan on 08/01/2018 12:13:47  Our recommendations are outlined below.  Recommendations:     .  Activate Stroke Protocol Admission/Order Set     .  Stroke/Telemetry Floor     .  Neuro Checks     .  Bedside Swallow Eval     .  DVT Prophylaxis     .  IV Fluids, Normal Saline     .  Head of Bed 30 Degrees     .  Euglycemia and Avoid Hyperthermia (PRN Acetaminophen)     .  Antiplatelet Therapy Recommended     .  Hold Antithrombotics for Now     .  consider mri in the ed - if unrevealing, can discharge home  Routine Consultation with [facility] for Follow up Care  Sign Out:     .  Discussed with Emergency Department  Provider    ------------------------------------------------------------------------------  History of Present Illness: Patient is a 43 year old Male.  Patient was brought by EMS for symptoms of L arm numbness  43yo M w pmh of htn p/w acute onset L arm numbness and clammy sensation as well as near syncope.  Last seen normal was within 4.5 hours.  Examination: 1A: Level of Consciousness - Alert; keenly responsive + 0 1B: Ask Month and Age - Both Questions Right + 0 1C: Blink Eyes & Squeeze Hands - Performs Both Tasks + 0 2: Test Horizontal Extraocular Movements - Normal + 0 3: Test Visual Fields - No Visual Loss + 0 4: Test Facial Palsy (Use Grimace if Obtunded) - Normal symmetry + 0 5A: Test Left Arm Motor Drift - No Drift for 10 Seconds + 0 5B: Test Right Arm Motor Drift - No Drift for 10 Seconds + 0 6A: Test Left Leg Motor Drift - No Drift for 5 Seconds + 0 6B: Test Right Leg Motor Drift - No Drift for 5 Seconds + 0 7: Test Limb Ataxia (FNF/Heel-Shin) - No Ataxia + 0 8: Test Sensation - Mild-Moderate Loss: Less Sharp/More Dull + 1 9: Test Language/Aphasia - Normal; No aphasia + 0 10: Test Dysarthria - Normal + 0 11: Test Extinction/Inattention - No abnormality + 0  NIHSS Score: 1  Patient/Family was informed the Neurology Consult would happen via TeleHealth consult by way of interactive audio and  video telecommunications and consented to receiving care in this manner.  Due to the immediate potential for life-threatening deterioration due to underlying acute neurologic illness, I spent 35 minutes providing critical care. This time includes time for face to face visit via telemedicine, review of medical records, imaging studies and discussion of findings with providers, the patient and/or family.   Dr Barron Schmid   [facility] [phone]   Case [id number]

## 2018-08-08 ENCOUNTER — Other Ambulatory Visit: Payer: Self-pay | Admitting: Family Medicine

## 2018-08-09 NOTE — Telephone Encounter (Signed)
May have prescription for 24 tabletsSame instruction 1 refill

## 2018-11-13 ENCOUNTER — Other Ambulatory Visit: Payer: Self-pay

## 2018-11-13 ENCOUNTER — Encounter (HOSPITAL_COMMUNITY): Payer: Self-pay | Admitting: *Deleted

## 2018-11-13 ENCOUNTER — Emergency Department (HOSPITAL_COMMUNITY)
Admission: EM | Admit: 2018-11-13 | Discharge: 2018-11-14 | Disposition: A | Discharge status: Home / Self Care | Payer: BC Managed Care – PPO | Attending: Emergency Medicine | Admitting: Emergency Medicine

## 2018-11-13 DIAGNOSIS — X58XXXA Exposure to other specified factors, initial encounter: Secondary | ICD-10-CM | POA: Insufficient documentation

## 2018-11-13 DIAGNOSIS — Y929 Unspecified place or not applicable: Secondary | ICD-10-CM | POA: Diagnosis not present

## 2018-11-13 DIAGNOSIS — Z79899 Other long term (current) drug therapy: Secondary | ICD-10-CM | POA: Insufficient documentation

## 2018-11-13 DIAGNOSIS — Y999 Unspecified external cause status: Secondary | ICD-10-CM | POA: Diagnosis not present

## 2018-11-13 DIAGNOSIS — Y9384 Activity, sleeping: Secondary | ICD-10-CM | POA: Diagnosis not present

## 2018-11-13 DIAGNOSIS — I10 Essential (primary) hypertension: Secondary | ICD-10-CM | POA: Insufficient documentation

## 2018-11-13 DIAGNOSIS — T162XXA Foreign body in left ear, initial encounter: Secondary | ICD-10-CM | POA: Insufficient documentation

## 2018-11-13 NOTE — Discharge Instructions (Addendum)
Your ear canal appears healthy with no injury sustained from this foreign body.

## 2018-11-13 NOTE — ED Triage Notes (Signed)
Pt has a piece of an earbud in his left ear; pt denies any pain

## 2018-11-14 NOTE — ED Provider Notes (Signed)
[facility] EMERGENCY DEPARTMENT Provider Note   CSN: [id number] Arrival date & time: 11/13/18  2312     History   Chief Complaint Chief Complaint  Patient presents with  . Foreign Body in [place] is a 43 y.o. male presenting with an ear bud lodged in his left ear. He falls asleep listening to music with earbuds and it broke off in the ear. He denies pain in the ear, but does have decreased hearing acuity on that side.  He attempted to remove it using a tweezers without success.     The history is provided by the patient.    Past Medical History:  Diagnosis Date  . Anxiety   . Depression   . Hypertension   . Kidney stone   . Kidney stones   . Low testosterone   . Reflux   . Sleep apnea     Patient Active Problem List   Diagnosis Date Noted  . HTN (hypertension) 02/23/2015  . Lumbar pain 11/06/2013  . Plantar fasciitis of right foot 05/13/2013  . Family history of melanoma 05/13/2013  . Kidney stones 10/18/2012  . Obstructive sleep apnea 10/18/2012  . Insomnia 10/18/2012  . Generalized anxiety disorder 10/18/2012  . Morbid obesity ([place]) 10/18/2012    Past Surgical History:  Procedure Laterality Date  . CYSTOSCOPY WITH RETROGRADE PYELOGRAM, URETEROSCOPY AND STENT PLACEMENT Left 09/09/2016   Procedure: CYSTOSCOPY WITH RETROGRADE PYELOGRAM, URETEROSCOPY AND STENT PLACEMENT, FIRST STAGE;  Surgeon: Alexis Frock, MD;  Location: [facility];  Service: Urology;  Laterality: Left;  . CYSTOSCOPY WITH RETROGRADE PYELOGRAM, URETEROSCOPY AND STENT PLACEMENT Left 09/30/2016   Procedure: 2ND STAGE CYSTOSCOPY WITH RETROGRADE PYELOGRAM, URETEROSCOPY AND STENT EXCHANGE;  Surgeon: Alexis Frock, MD;  Location: [facility];  Service: Urology;  Laterality: Left;  . HOLMIUM LASER APPLICATION Left [id number]   Procedure: HOLMIUM LASER APPLICATION;  Surgeon: Alexis Frock, MD;  Location: [facility];  Service: Urology;  Laterality: Left;  . HOLMIUM LASER APPLICATION Left  [id number]   Procedure: HOLMIUM LASER APPLICATION;  Surgeon: Alexis Frock, MD;  Location: [facility];  Service: Urology;  Laterality: Left;  Marland Kitchen VASECTOMY  2002        Home Medications    Prior to Admission medications   Medication Sig Start Date End Date Taking? Authorizing Provider  amLODipine (NORVASC) 10 MG tablet Take 1 tablet (10 mg total) by mouth daily. 05/10/18   Kathyrn Drown, MD  chlorzoxazone (PARAFON) 500 MG tablet TAKE 1 TABLET BY MOUTH THREE TIMES DAILY AS NEEDED FOR MUSCLE SPASM (DO  NOT  TAKE  WITH  PAIN  MEDICATION) 08/10/18   Kathyrn Drown, MD  clonazePAM (KLONOPIN) 0.5 MG tablet TAKE 1/2 TO 1 (ONE-HALF TO ONE) TABLET BY MOUTH TWICE DAILY AS NEEDED FOR ANXIETY USE  SPARINGLY 10/20/17   Luking, Elayne Snare, MD  diphenhydrAMINE (BENADRYL) 25 MG tablet Take 25 mg by mouth daily as needed for allergies.    [provider]  lisinopril-hydrochlorothiazide (PRINZIDE,ZESTORETIC) 20-12.5 MG tablet TAKE 1 TABLET BY MOUTH ONCE DAILY **CHANGE  IN  DOSE** 05/10/18   Kathyrn Drown, MD  naproxen (NAPROSYN) 500 MG tablet TAKE 1 TABLET BY MOUTH TWICE DAILY WITH MEALS **PATIENT  NEEDS  OFFICE  VISIT** 03/23/18   Kathyrn Drown, MD    Family History History reviewed. No pertinent family history.  Social History Social History   Tobacco Use  . Smoking status: Never Smoker  . Smokeless tobacco: Never Used  Substance Use Topics  . Alcohol use: No  . Drug use: No     Allergies   Patient has no known allergies.   Review of Systems Review of Systems  Constitutional: Negative for chills and fever.  HENT: Positive for hearing loss. Negative for congestion, ear discharge, ear pain, rhinorrhea, sinus pressure, sore throat, trouble swallowing and voice change.   Eyes: Negative.   Respiratory: Negative.   Cardiovascular: Negative.   Genitourinary: Negative.      Physical Exam Updated Vital Signs BP 127/89 (BP Location: Right Arm)   Pulse 70   Temp 98 F (36.7 C) (Oral)    Resp 18   Ht 5\' 7"  (1.702 m)   Wt (!) 158.8 kg   SpO2 99%   BMI 54.82 kg/m   Physical Exam Constitutional:      Appearance: He is well-developed.  HENT:     Head: Normocephalic and atraumatic.     Right Ear: Tympanic membrane and ear canal normal.     Left Ear: A foreign body is present.     Ears:     Comments: Black ear tip bud lodged left ear canal.    Nose: Mucosal edema and rhinorrhea present.     Mouth/Throat:     Pharynx: Uvula midline. No oropharyngeal exudate or posterior oropharyngeal erythema.     Tonsils: No tonsillar abscesses.  Eyes:     Conjunctiva/sclera: Conjunctivae normal.  Cardiovascular:     Rate and Rhythm: Normal rate.     Heart sounds: Normal heart sounds.  Pulmonary:     Effort: Pulmonary effort is normal. No respiratory distress.     Breath sounds: No wheezing or rales.  Abdominal:     Palpations: Abdomen is soft.     Tenderness: There is no abdominal tenderness.  Musculoskeletal: Normal range of motion.  Skin:    General: Skin is warm and dry.     Findings: No rash.  Neurological:     Mental Status: He is alert and oriented to person, place, and time.      ED Treatments / Results  Labs (all labs ordered are listed, but only abnormal results are displayed) Labs Reviewed - No data to display  EKG None  Radiology No results found.  Procedures .Foreign Body Removal  Date/Time: 11/13/2018 11:50 PM Performed by: Evalee Jefferson, PA-C Authorized by: Evalee Jefferson, PA-C  Consent: Verbal consent obtained. Risks and benefits: risks, benefits and alternatives were discussed Consent given by: patient Patient understanding: patient states understanding of the procedure being performed Patient identity confirmed: verbally with patient Time out: Immediately prior to procedure a "time out" was called to verify the correct patient, procedure, equipment, support staff and site/side marked as required. Body area: ear Location details: left ear   Sedation: Patient sedated: no  Localization method: visualized Removal mechanism: alligator forceps Complexity: simple 1 objects recovered. Objects recovered: ear bud Comments: Examined post removal, no TM or canal irritation or injury.   (including critical care time)  Medications Ordered in ED Medications - No data to display   Initial Impression / Assessment and Plan / ED Course  I have reviewed the triage vital signs and the nursing notes.  Pertinent labs & imaging results that were available during my care of the patient were reviewed by me and considered in my medical decision making (see chart for details).        Prn f/u anticipated.  Final Clinical Impressions(s) / ED Diagnoses   Final diagnoses:  Ear  foreign body, left, initial encounter    ED Discharge Orders    None       Landis Martins 11/14/18 1618    Rancour, Annie Main, MD 11/14/18 [phone]

## 2018-11-26 ENCOUNTER — Other Ambulatory Visit: Payer: Self-pay | Admitting: Family Medicine

## 2018-11-27 NOTE — Telephone Encounter (Signed)
1.  According to our records patient not on these please verify with the patient regarding is he on these?  Starting?  Will need to follow-up this fall

## 2018-11-29 NOTE — Telephone Encounter (Signed)
I am electing not to refill these until the patient verifies thank you I suggest sending message to the pharmacy that the patient calls

## 2018-11-29 NOTE — Telephone Encounter (Signed)
prozac was last filled 08/2018 for a 90 day supply(the original script was written 1 year ago- the other 2 meds have not been filled in over 6 months

## 2018-11-29 NOTE — Telephone Encounter (Signed)
Left message to return call to see if patient taking these meds

## 2018-11-30 ENCOUNTER — Other Ambulatory Visit: Payer: Self-pay

## 2018-11-30 ENCOUNTER — Telehealth: Payer: Self-pay | Admitting: *Deleted

## 2018-11-30 MED ORDER — FLUOXETINE HCL 20 MG PO TABS: ORAL_TABLET | ORAL | 0 refills | Status: DC

## 2018-11-30 MED ORDER — GABAPENTIN 100 MG PO CAPS: 100.0000 mg | ORAL_CAPSULE | Freq: Three times a day (TID) | ORAL | 0 refills | Status: DC

## 2018-11-30 MED ORDER — NAPROXEN 500 MG PO TABS: ORAL_TABLET | ORAL | 0 refills | Status: DC

## 2018-11-30 NOTE — Telephone Encounter (Signed)
Medications sent to [facility]. Pt contacted and is aware. Pt will call back for appt

## 2018-11-30 NOTE — Telephone Encounter (Signed)
Pt requesting refill on gabapentin, naproxen and prozac. Pt states he takes for pinched nerve in his neck. States he was seen in ED back in May. Gabapentin and prozac are not on med list but pt states he is taking. prozac is on med history last sent in 06/21/17 with 3 extra refills prozac 20mg  take 3 daily and gabapentin 100mg  one tid last sent in on 06/21/17 with 3 refills. Last seen 05/26/18 for cough. Wellness 05/10/18

## 2018-11-30 NOTE — Telephone Encounter (Signed)
May ref all times one mo at current dose, rec ov with dr Nicki Reaper before finished

## 2019-01-07 ENCOUNTER — Ambulatory Visit (INDEPENDENT_AMBULATORY_CARE_PROVIDER_SITE_OTHER): Payer: BC Managed Care – PPO | Admitting: Family Medicine

## 2019-01-07 DIAGNOSIS — Z13220 Encounter for screening for lipoid disorders: Secondary | ICD-10-CM | POA: Diagnosis not present

## 2019-01-07 DIAGNOSIS — I10 Essential (primary) hypertension: Secondary | ICD-10-CM | POA: Diagnosis not present

## 2019-01-07 DIAGNOSIS — N289 Disorder of kidney and ureter, unspecified: Secondary | ICD-10-CM

## 2019-01-07 MED ORDER — NAPROXEN 500 MG PO TABS: ORAL_TABLET | ORAL | 2 refills | Status: DC

## 2019-01-07 MED ORDER — LISINOPRIL-HYDROCHLOROTHIAZIDE 20-12.5 MG PO TABS: ORAL_TABLET | ORAL | 1 refills | Status: DC

## 2019-01-07 MED ORDER — CLONAZEPAM 0.5 MG PO TABS: ORAL_TABLET | ORAL | 1 refills | Status: DC

## 2019-01-07 MED ORDER — AMLODIPINE BESYLATE 10 MG PO TABS: 10.0000 mg | ORAL_TABLET | Freq: Every day | ORAL | 1 refills | Status: DC

## 2019-01-07 MED ORDER — FLUOXETINE HCL 20 MG PO TABS: ORAL_TABLET | ORAL | 0 refills | Status: DC

## 2019-01-07 MED ORDER — GABAPENTIN 100 MG PO CAPS: 100.0000 mg | ORAL_CAPSULE | Freq: Three times a day (TID) | ORAL | 5 refills | Status: DC

## 2019-01-07 NOTE — Progress Notes (Signed)
   Subjective:    Patient ID: Jose Adkins, male    DOB: Nov 08, 1975, 43 y.o.   MRN: [id number]  HPI Pt is needing refills on all chronic medications. Pt states he has no issues or concerns at this time. Pt states he has about 2 Klonopin left and uses them sparingly but would like a refill.  Patient states his moods overall doing well Prozac does help him Denies being depressed currently would like to continue the medicine Uses Klonopin sparingly  Patient for blood pressure check up.  The patient does have hypertension.  The patient is on medication.  Patient relates compliance with meds. Todays BP reviewed with the patient. Patient denies issues with medication. Patient relates reasonable diet. Patient tries to minimize salt. Patient aware of BP goals. Does try to be careful with his diet is trying to stay physically active takes his blood pressure medicine  Has renal insufficiency we talked about backing down on naproxen only using it when absolutely necessary minimizing over usage we also talked about how that can increase the risk of developing ulcers Virtual Visit via Video Note  I connected with [name] on 01/07/19 at  3:00 PM EDT by a video enabled telemedicine application and verified that I am speaking with the correct person using two identifiers.  Location: Patient: home Provider: office   I discussed the limitations of evaluation and management by telemedicine and the availability of in person appointments. The patient expressed understanding and agreed to proceed.  History of Present Illness:    Observations/Objective:   Assessment and Plan:   Follow Up Instructions:    I discussed the assessment and treatment plan with the patient. The patient was provided an opportunity to ask questions and all were answered. The patient agreed with the plan and demonstrated an understanding of the instructions.   The patient was advised to call back or seek an  in-person evaluation if the symptoms worsen or if the condition fails to improve as anticipated.  I provided 17 minutes of non-face-to-face time during this encounter.   Vicente Males, LPN    Review of Systems  Constitutional: Negative for diaphoresis and fatigue.  HENT: Negative for congestion and rhinorrhea.   Respiratory: Negative for cough and shortness of breath.   Cardiovascular: Negative for chest pain and leg swelling.  Gastrointestinal: Negative for abdominal pain and diarrhea.  Skin: Negative for color change and rash.  Neurological: Negative for dizziness and headaches.  Psychiatric/Behavioral: Negative for behavioral problems and confusion.       Objective:   Physical Exam Today's visit was via telephone Physical exam was not possible for this visit        Assessment & Plan:  HTN continue medication minimize salt stay active  Renal insufficiency check lab work minimize NSAID use continue current medications otherwise  Stress anxiety with a history of depression continue Prozac use Klonopin sparingly only use when at home  Follow-up within 6 months  Morbid obesity minimize calories stay active try to lose weight

## 2019-01-23 DIAGNOSIS — Z20828 Contact with and (suspected) exposure to other viral communicable diseases: Secondary | ICD-10-CM | POA: Diagnosis not present

## 2019-02-19 ENCOUNTER — Telehealth: Payer: Self-pay | Admitting: Family Medicine

## 2019-02-19 DIAGNOSIS — I10 Essential (primary) hypertension: Secondary | ICD-10-CM

## 2019-02-19 DIAGNOSIS — Z13220 Encounter for screening for lipoid disorders: Secondary | ICD-10-CM

## 2019-02-19 NOTE — Telephone Encounter (Signed)
Labs orders placed. Left message to return call. Also need to get information on which CPAP supplies he is needing.

## 2019-02-19 NOTE — Telephone Encounter (Signed)
See other message. Message sent to [facility] for the cpap supplies

## 2019-02-19 NOTE — Telephone Encounter (Signed)
Last labs completed on 05/10/2018 BMET, LIPID, HEPATIC. Please advise. Thank you

## 2019-02-19 NOTE — Telephone Encounter (Signed)
Pt notified bw orders are ready. And he needs the mask and the tubing. He got it last from [place].

## 2019-02-19 NOTE — Telephone Encounter (Signed)
Please go ahead with a prescription for mask and tubing from [facility] for his CPAP supplies

## 2019-02-19 NOTE — Telephone Encounter (Signed)
Metabolic 7, lipid

## 2019-02-19 NOTE — Telephone Encounter (Signed)
Pt is calling requesting lab orders be placed. He was seen in Oct and told to call back and set up blood work.   Also he is checking on his CPAP supplies when he spoke with Dr. Nicki Reaper in Oct he said he would help him get new supplies.

## 2019-02-28 NOTE — Telephone Encounter (Signed)
Order written, signed & faxed

## 2019-03-04 DIAGNOSIS — Z13220 Encounter for screening for lipoid disorders: Secondary | ICD-10-CM | POA: Diagnosis not present

## 2019-03-04 DIAGNOSIS — I10 Essential (primary) hypertension: Secondary | ICD-10-CM | POA: Diagnosis not present

## 2019-03-05 ENCOUNTER — Encounter: Payer: Self-pay | Admitting: Family Medicine

## 2019-03-05 LAB — LIPID PANEL
Chol/HDL Ratio: 4.1 ratio (ref 0.0–5.0)
Cholesterol, Total: 147 mg/dL (ref 100–199)
HDL: 36 mg/dL — ABNORMAL LOW (ref >39)
LDL Chol Calc (NIH): 95 mg/dL (ref 0–99)
Triglycerides: 82 mg/dL (ref 0–149)
VLDL Cholesterol Cal: 16 mg/dL (ref 5–40)

## 2019-03-05 LAB — BASIC METABOLIC PANEL
BUN/Creatinine Ratio: 20 (ref 9–20)
BUN: 24 mg/dL (ref 6–24)
CO2: 22 mmol/L (ref 20–29)
Calcium: 9.7 mg/dL (ref 8.7–10.2)
Chloride: 99 mmol/L (ref 96–106)
Creatinine, Ser: 1.2 mg/dL (ref 0.76–1.27)
GFR calc Af Amer: 85 mL/min/{1.73_m2} (ref >59)
GFR calc non Af Amer: 73 mL/min/{1.73_m2} (ref >59)
Glucose: 78 mg/dL (ref 65–99)
Potassium: 4.8 mmol/L (ref 3.5–5.2)
Sodium: 137 mmol/L (ref 134–144)

## 2019-04-09 ENCOUNTER — Ambulatory Visit: Payer: BC Managed Care – PPO | Admitting: Family Medicine

## 2019-04-09 ENCOUNTER — Other Ambulatory Visit: Payer: Self-pay

## 2019-04-09 DIAGNOSIS — R05 Cough: Secondary | ICD-10-CM | POA: Diagnosis not present

## 2019-04-09 DIAGNOSIS — J00 Acute nasopharyngitis [common cold]: Secondary | ICD-10-CM | POA: Diagnosis not present

## 2019-04-09 DIAGNOSIS — Z1152 Encounter for screening for COVID-19: Secondary | ICD-10-CM | POA: Diagnosis not present

## 2019-04-11 ENCOUNTER — Ambulatory Visit (INDEPENDENT_AMBULATORY_CARE_PROVIDER_SITE_OTHER): Payer: BC Managed Care – PPO | Admitting: Family Medicine

## 2019-04-11 ENCOUNTER — Other Ambulatory Visit: Payer: Self-pay

## 2019-04-11 DIAGNOSIS — J019 Acute sinusitis, unspecified: Secondary | ICD-10-CM | POA: Diagnosis not present

## 2019-04-11 MED ORDER — AMOXICILLIN 500 MG PO CAPS: 500.0000 mg | ORAL_CAPSULE | Freq: Three times a day (TID) | ORAL | 0 refills | Status: DC

## 2019-04-11 NOTE — Progress Notes (Signed)
   Subjective:    Patient ID: Jose Adkins, male    DOB: 1975-12-16, 44 y.o.   MRN: [id number]  Sinusitis This is a new problem. Episode onset: 4 days. There has been no fever. Associated symptoms include congestion, coughing, headaches and a sore throat. Pertinent negatives include no chills or ear pain. (Fatigue) Treatments tried: aleve cold and sinus.  pt had covid test 2 days ago and got results today that it was negative.   Virtual Visit via Video Note  I connected with Jose Adkins on 04/11/19 at  9:30 AM EST by a video enabled telemedicine application and verified that I am speaking with the correct person using two identifiers.  Location: Patient: home Provider: office   I discussed the limitations of evaluation and management by telemedicine and the availability of in person appointments. The patient expressed understanding and agreed to proceed.  History of Present Illness:    Observations/Objective:   Assessment and Plan:   Follow Up Instructions:    I discussed the assessment and treatment plan with the patient. The patient was provided an opportunity to ask questions and all were answered. The patient agreed with the plan and demonstrated an understanding of the instructions.   The patient was advised to call back or seek an in-person evaluation if the symptoms worsen or if the condition fails to improve as anticipated.  I provided 17 minutes of non-face-to-face time during this encounter.       Review of Systems  Constitutional: Negative for activity change, chills and fever.  HENT: Positive for congestion, rhinorrhea and sore throat. Negative for ear pain.   Eyes: Negative for discharge.  Respiratory: Positive for cough. Negative for wheezing.   Cardiovascular: Negative for chest pain.  Gastrointestinal: Negative for nausea and vomiting.  Musculoskeletal: Negative for arthralgias.  Neurological: Positive for headaches.       Objective:   Physical Exam  Patient had virtual visit Appears to be in no distress Atraumatic Neuro able to relate and oriented No apparent resp distress Color normal   Patient has done a fantastic job losing weight through diet and exercise he will continue this and follow-up in the summer recent lab work looked goodfor small    Assessment & Plan:  Viral related illness Secondary rhinosinusitis Negative Covid test Patient was encouraged if he starts having chest tightness pressure pain shortness of breath he to consider doing a follow-up Covid test Also if the patient starts having significant chest congestion shortness of breath go to urgent care or ER Antibiotic sent and warning signs discussed

## 2019-04-23 ENCOUNTER — Other Ambulatory Visit: Payer: Self-pay | Admitting: Family Medicine

## 2019-04-24 NOTE — Telephone Encounter (Signed)
Med check up on 01/07/19

## 2019-05-21 ENCOUNTER — Ambulatory Visit (HOSPITAL_COMMUNITY)
Admission: RE | Admit: 2019-05-21 | Discharge: 2019-05-21 | Disposition: A | Discharge status: Home / Self Care | Payer: BC Managed Care – PPO | Source: Ambulatory Visit | Attending: Family Medicine | Admitting: Family Medicine

## 2019-05-21 ENCOUNTER — Ambulatory Visit: Payer: BC Managed Care – PPO | Admitting: Family Medicine

## 2019-05-21 ENCOUNTER — Encounter: Payer: Self-pay | Admitting: Family Medicine

## 2019-05-21 ENCOUNTER — Encounter (HOSPITAL_COMMUNITY): Payer: Self-pay

## 2019-05-21 ENCOUNTER — Other Ambulatory Visit: Payer: Self-pay

## 2019-05-21 VITALS — BP 120/80 | Temp 97.5°F | Wt 304.2 lb

## 2019-05-21 DIAGNOSIS — R634 Abnormal weight loss: Secondary | ICD-10-CM | POA: Diagnosis not present

## 2019-05-21 DIAGNOSIS — R222 Localized swelling, mass and lump, trunk: Secondary | ICD-10-CM

## 2019-05-21 NOTE — Progress Notes (Signed)
   Subjective:    Patient ID: Jose Adkins, male    DOB: 09/19/1975, 44 y.o.   MRN: [id number]  HPI Pt has noticed a knot in middle of chest. Causes shortness of breath at times. Pt states it is about the size of a barbie doll head. Pt has lost about 50 lbs since December.  Patient is done a fantastic job of watching his diet doing activity and losing weight ever since then he has noticed an increase mass in the lower sternum region he states that this is becoming more prominent over time he denies any injury.  Denies any chest tightness pressure pain or shortness of breath states his overall energy level is okay no sweats chills or fever  Review of Systems See above    Objective:   Physical Exam Lungs are clear respiratory rate normal heart is regular no murmurs sternum nontender xiphoid large and prominent approximately inch in diameter extremities no edema skin warm dry       Assessment & Plan:  Prominent xiphoid X-ray indicated May need CAT scan to help differentiate if this is osteochondroma or a sign of cancer or just a benign issue

## 2019-06-13 DIAGNOSIS — Z23 Encounter for immunization: Secondary | ICD-10-CM | POA: Diagnosis not present

## 2019-07-01 ENCOUNTER — Ambulatory Visit: Payer: BC Managed Care – PPO | Admitting: Family Medicine

## 2019-07-01 ENCOUNTER — Encounter: Payer: Self-pay | Admitting: Family Medicine

## 2019-07-01 ENCOUNTER — Other Ambulatory Visit: Payer: Self-pay

## 2019-07-01 ENCOUNTER — Telehealth: Payer: Self-pay | Admitting: Family Medicine

## 2019-07-01 VITALS — BP 108/82 | Temp 98.4°F | Wt 293.4 lb

## 2019-07-01 DIAGNOSIS — Z13220 Encounter for screening for lipoid disorders: Secondary | ICD-10-CM

## 2019-07-01 DIAGNOSIS — I951 Orthostatic hypotension: Secondary | ICD-10-CM

## 2019-07-01 DIAGNOSIS — Z79899 Other long term (current) drug therapy: Secondary | ICD-10-CM

## 2019-07-01 DIAGNOSIS — I10 Essential (primary) hypertension: Secondary | ICD-10-CM

## 2019-07-01 DIAGNOSIS — N289 Disorder of kidney and ureter, unspecified: Secondary | ICD-10-CM

## 2019-07-01 NOTE — Telephone Encounter (Signed)
Patient has physical on 5/11 and needing labs done

## 2019-07-01 NOTE — Progress Notes (Signed)
   Subjective:    Patient ID: ROARKE DUTKIEWICZ, male    DOB: 1976-02-18, 44 y.o.   MRN: [id number]  HPI Patient comes in today with complaints of low blood pressure. This morning patient experienced a syncope episode and BP was 91/80. Was doing well till this morning Had presyncope episode Sugar ok at 102 meds not taking bp meds cause they have been good Patient stopped bp meds mid February. Doing a lot of walking and push ups Breathing good No chest pain  Review of Systems     Objective:   Physical Exam Lungs clear heart regular HEENT benign blood pressure was checked lying sitting standing was on the low end but not severely       Assessment & Plan:  Low blood pressure I believe part of this is related to a drastically reduced calorie count he is taking it I encouraged him to liberalize this He was taking in less than 1100 cal a day I have encouraged him to increase this to 1500 Patient is to give [place] feedback if things are not improving over the course of the next several days Patient does not want to do blood work currently but will call back for the orders Follow-up within 2 months for wellness checkup

## 2019-07-02 NOTE — Telephone Encounter (Signed)
Blood work ordered in Epic. Patient notified. 

## 2019-07-02 NOTE — Telephone Encounter (Signed)
Lipid, liver, metabolic 7, CBC Wellness exam screening [employer] presyncope

## 2019-07-06 DIAGNOSIS — Z23 Encounter for immunization: Secondary | ICD-10-CM | POA: Diagnosis not present

## 2019-07-30 ENCOUNTER — Other Ambulatory Visit: Payer: Self-pay

## 2019-07-30 ENCOUNTER — Ambulatory Visit (INDEPENDENT_AMBULATORY_CARE_PROVIDER_SITE_OTHER): Payer: BC Managed Care – PPO | Admitting: Family Medicine

## 2019-07-30 VITALS — BP 118/76 | Temp 97.6°F | Ht 66.5 in | Wt 295.8 lb

## 2019-07-30 DIAGNOSIS — Z0000 Encounter for general adult medical examination without abnormal findings: Secondary | ICD-10-CM

## 2019-07-30 MED ORDER — TRIAMCINOLONE ACETONIDE 0.1 % EX CREA: 1.0000 "application " | TOPICAL_CREAM | Freq: Two times a day (BID) | CUTANEOUS | 4 refills | Status: DC

## 2019-07-30 MED ORDER — FLUOXETINE HCL 20 MG PO CAPS: ORAL_CAPSULE | ORAL | 1 refills | Status: DC

## 2019-07-30 NOTE — Progress Notes (Signed)
   Subjective:    Patient ID: Jose Adkins, male    DOB: [birth year], 44 y.o.   MRN: [id number]  HPI  The patient comes in today for a wellness visit.  Patient doing really good job watching his diet he is exercising trying to bring his weight down is lost 20 pounds over the past few weeks he states his moods are doing good he would like to continue the Prozac he denies being depressed  A review of their health history was completed.  A review of medications was also completed.  Any needed refills; Prozac  Eating habits: Great  Falls/  MVA accidents in past few months: none  Regular exercise: yes  Specialist pt sees on regular basis: none  Preventative health issues were discussed.   Additional concerns: Rash on arms from oils and gases at work, itches at times.   Review of Systems  Constitutional: Negative for diaphoresis and fatigue.  HENT: Negative for congestion and rhinorrhea.   Respiratory: Negative for cough and shortness of breath.   Cardiovascular: Negative for chest pain and leg swelling.  Gastrointestinal: Negative for abdominal pain and diarrhea.  Skin: Negative for color change and rash.  Neurological: Negative for dizziness and headaches.  Psychiatric/Behavioral: Negative for behavioral problems and confusion.       Objective:   Physical Exam Vitals reviewed.  Constitutional:      General: He is not in acute distress. HENT:     Head: Normocephalic and atraumatic.  Eyes:     General:        Right eye: No discharge.        Left eye: No discharge.  Neck:     Trachea: No tracheal deviation.  Cardiovascular:     Rate and Rhythm: Normal rate and regular rhythm.     Heart sounds: Normal heart sounds. No murmur.  Pulmonary:     Effort: Pulmonary effort is normal. No respiratory distress.     Breath sounds: Normal breath sounds.  Lymphadenopathy:     Cervical: No cervical adenopathy.  Skin:    General: Skin is warm and dry.  Neurological:   Mental Status: He is alert.     Coordination: Coordination normal.  Psychiatric:        Behavior: Behavior normal.           Assessment & Plan:  Adult wellness-complete.wellness physical was conducted today. Importance of diet and exercise were discussed in detail.  In addition to this a discussion regarding safety was also covered. We also reviewed over immunizations and gave recommendations regarding current immunization needed for age.  In addition to this additional areas were also touched on including: Preventative health exams needed:  Colonoscopy not indicated  Patient was advised yearly wellness exam The patient desires to continue the Prozac we will go ahead with refills patient to follow-up within 6 months follow-up sooner if any problems

## 2020-04-21 ENCOUNTER — Other Ambulatory Visit: Payer: Self-pay | Admitting: Family Medicine

## 2020-04-21 MED ORDER — NAPROXEN 500 MG PO TABS: ORAL_TABLET | ORAL | 2 refills | Status: DC

## 2020-06-09 ENCOUNTER — Other Ambulatory Visit: Payer: Self-pay | Admitting: Family Medicine

## 2020-06-09 NOTE — Telephone Encounter (Signed)
May have 90-day refill needs follow-up office visit

## 2020-06-12 ENCOUNTER — Other Ambulatory Visit: Payer: Self-pay | Admitting: *Deleted

## 2020-06-17 NOTE — Telephone Encounter (Signed)
Appointment on 5/5 for medication follow up

## 2020-06-18 IMAGING — MR MRI HEAD WITHOUT CONTRAST
8 of 10 series · 34 of 48 positions shown · non-contrast
Comparison: 08/01/2018 CT head code stroke was negative.

CLINICAL DATA: Sudden onset of chest pain which was then
accompanied by LEFT arm numbness. Chest pain is improved.

EXAM:
MRI HEAD WITHOUT CONTRAST
TECHNIQUE: Multiplanar, multiecho pulse sequences of the brain and surrounding
structures were obtained without intravenous contrast.

[Series 2: T1 · sagittal · 5.0mm · 0.44mm/px · 2 of 20 slices shown (1 of 2)]
[im 1/20]
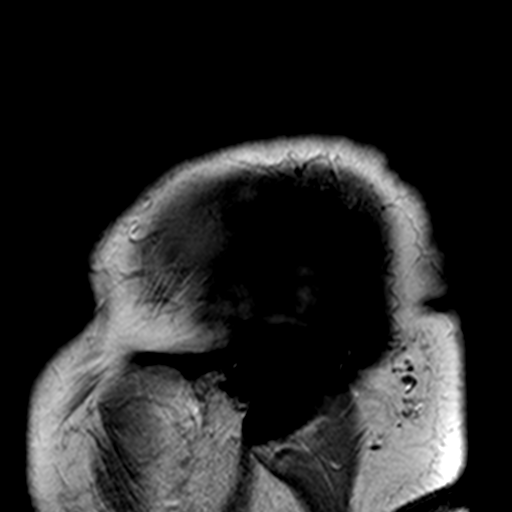
[im 20/20]
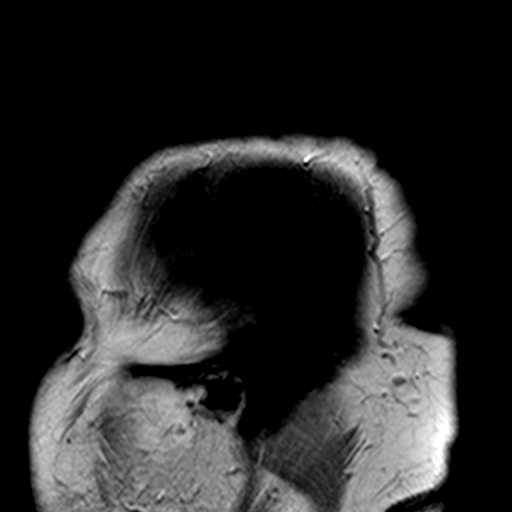

[Series 3: DWI · axial · 3.0mm · 0.71mm/px · z∈[-101,+61]mm · 6 of 55 slices shown (1 of 2)]
[im 1/55]
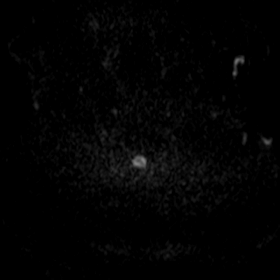
[im 11/55]
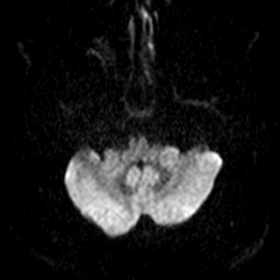
[im 22/55]
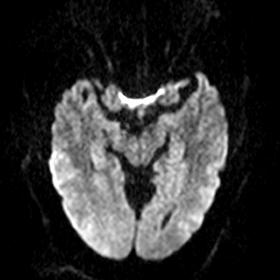
[im 33/55]
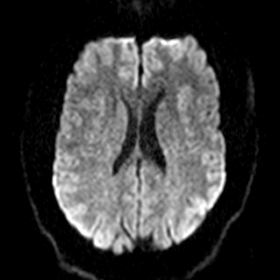
[im 44/55]
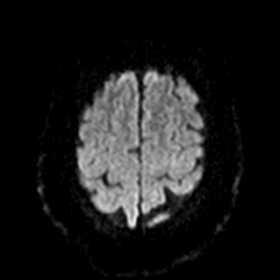
[im 55/55]
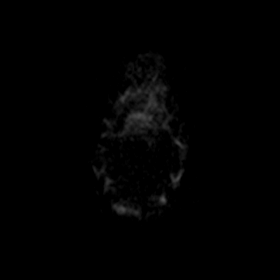

[Series 5: DWI · coronal · 5.0mm · 0.50mm/px · 4 of 34 slices shown (2 of 2)]
[im 1/34]
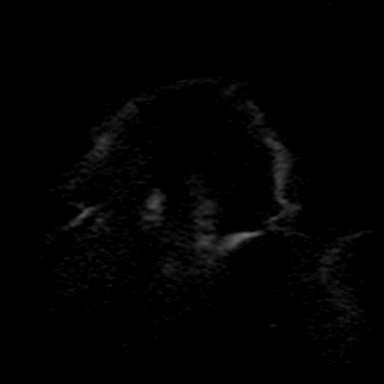
[im 12/34]
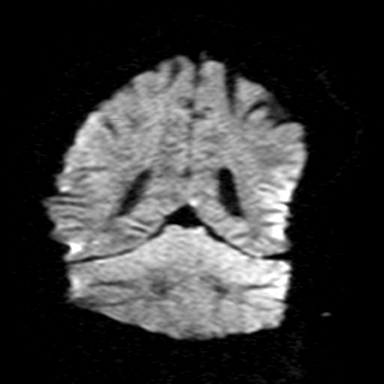
[im 23/34]
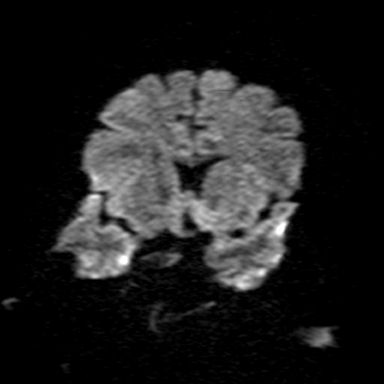
[im 34/34]
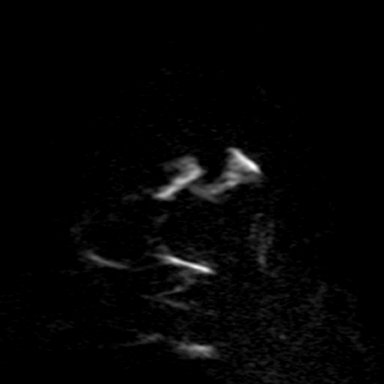

[Series 7: T2 · axial · 5.0mm · 0.69mm/px · z∈[-94,+61]mm · 3 of 25 slices shown (1 of 3)]
[im 1/25]
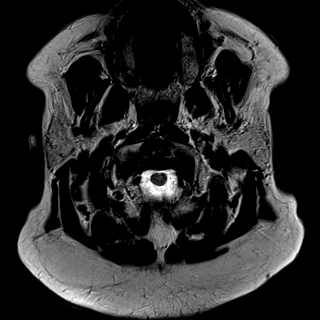
[im 13/25]
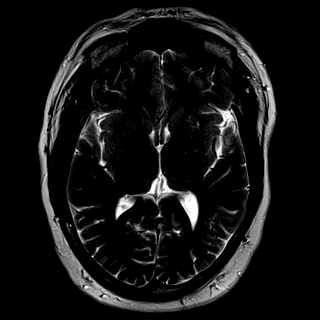
[im 25/25]
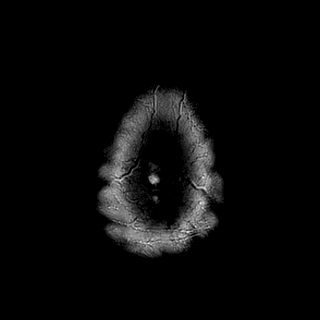

[Series 8: T2 · axial · 5.0mm · 0.42mm/px · z∈[-91,+58]mm · 3 of 24 slices shown (2 of 3)]
[im 1/24]
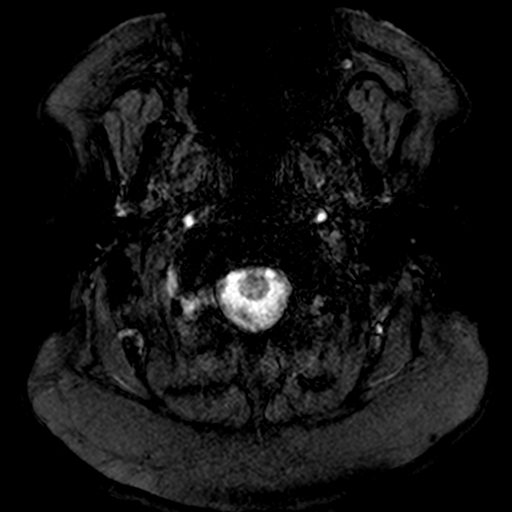
[im 12/24]
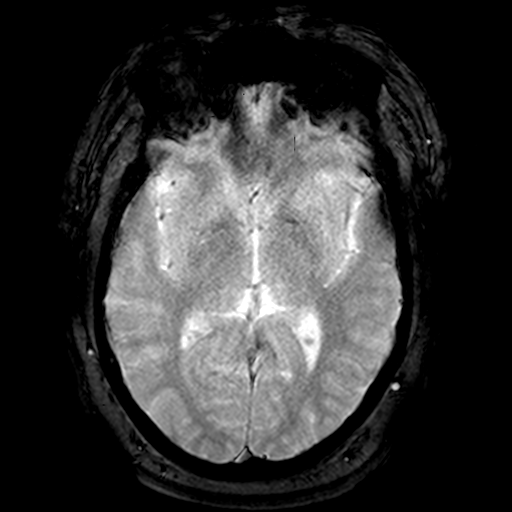
[im 24/24]
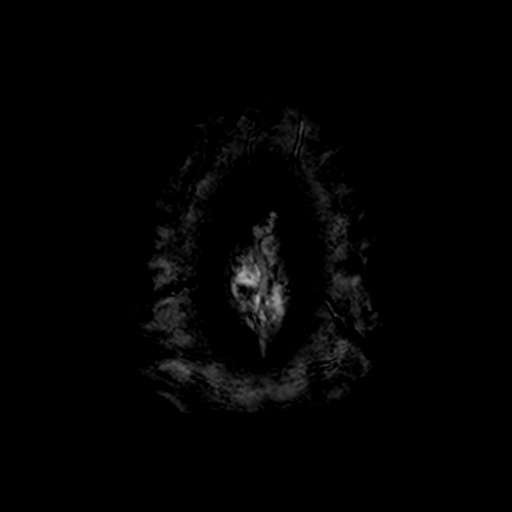

[Series 9: FLAIR · axial · 3.0mm · 0.87mm/px · z∈[-90,+59]mm · 6 of 51 slices shown]
[im 1/51]
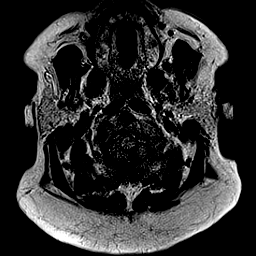
[im 11/51]
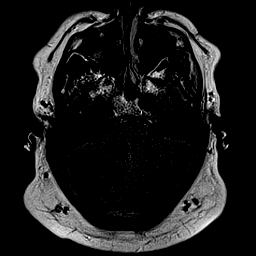
[im 21/51]
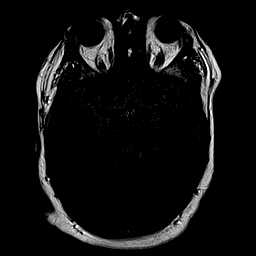
[im 31/51]
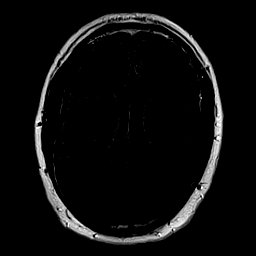
[im 41/51]
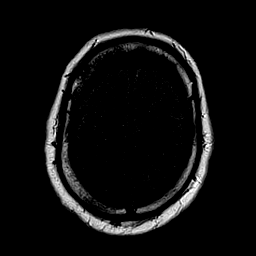
[im 51/51]
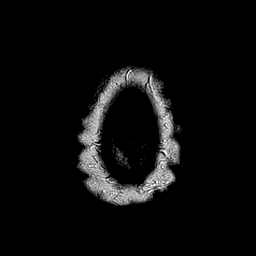

[Series 10: T1 · axial · 2.0mm · 0.45mm/px · z∈[-101,+45]mm · 7 of 93 slices shown (2 of 2)]
[im 1/93]
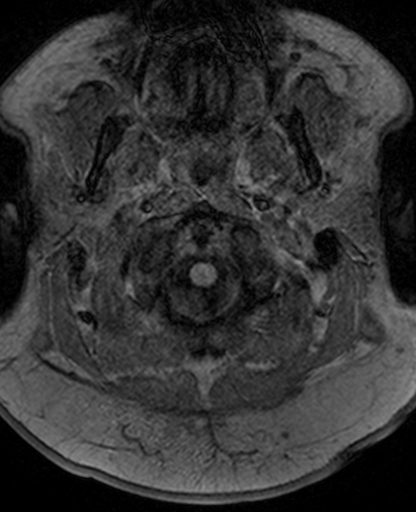
[im 19/93]
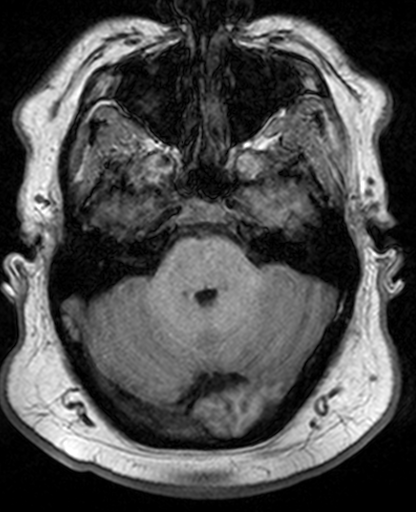
[im 28/93]
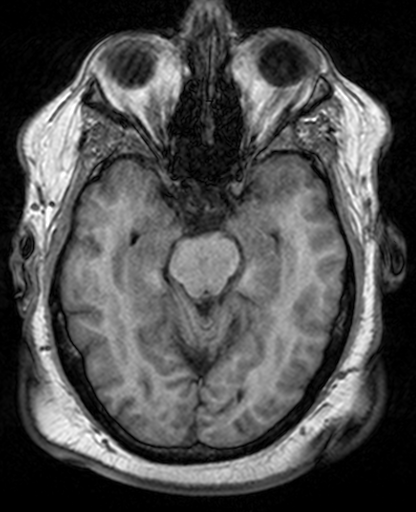
[im 37/93]
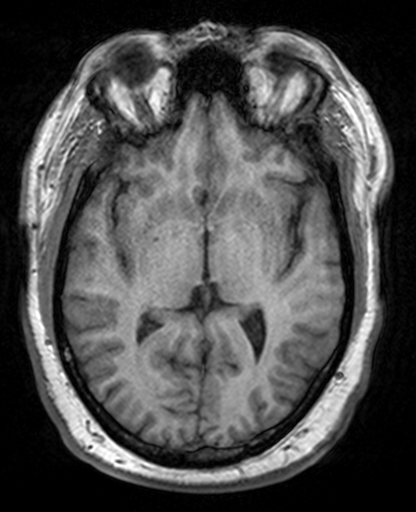
[im 56/93]
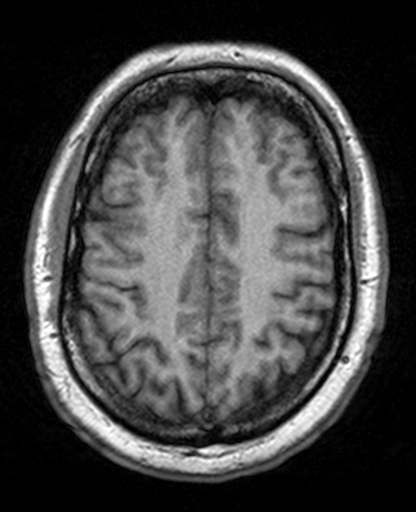
[im 65/93]
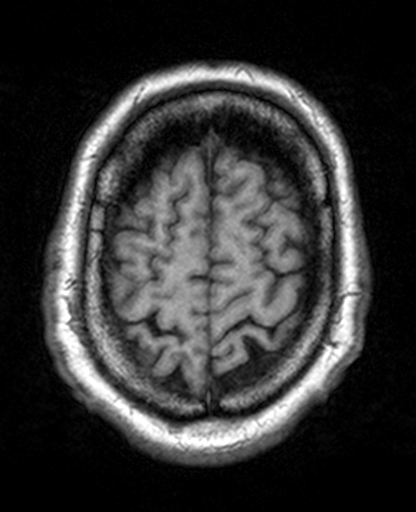
[im 74/93]
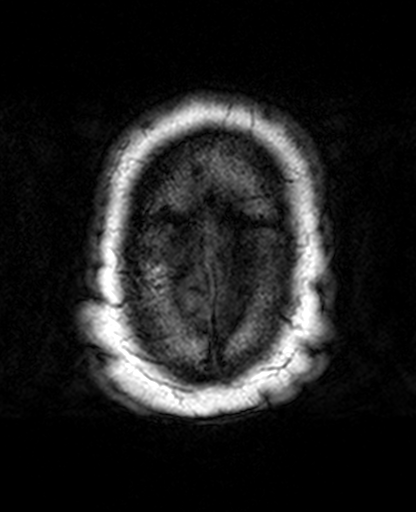

[Series 11: T2 · coronal · 5.0mm · 0.60mm/px · 3 of 28 slices shown (3 of 3)]
[im 1/28]
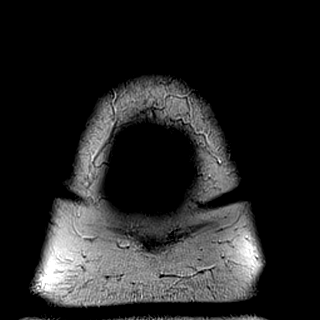
[im 14/28]
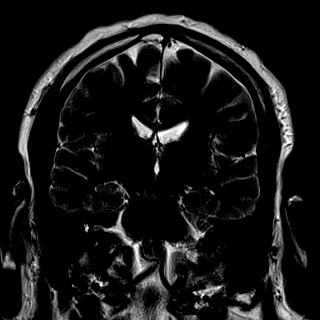
[im 28/28]
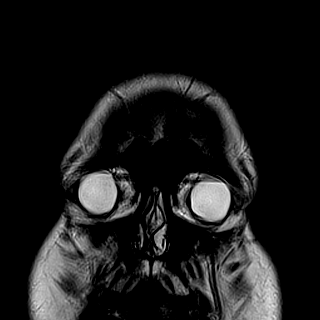

[34 of 48 positions shown; findings below may reference images not displayed]

FINDINGS: Brain: No evidence for acute infarction, hemorrhage, mass lesion,
hydrocephalus, or extra-axial fluid. Normal cerebral volume. No
white matter disease.

Vascular: Flow voids are maintained.  No chronic hemorrhage.

Skull and upper cervical spine: Normal marrow signal. Mild tonsillar
ectopia, but no frank Chiari I malformation.

Sinuses/Orbits: Maxillary sinus retention cysts, greater on the
RIGHT. No layering fluid. Negative orbits.

Other: None.
IMPRESSION: Negative exam. No acute or focal intracranial abnormality. No cause
is seen for the reported symptoms.

## 2020-06-18 IMAGING — CT CT HEAD CODE STROKE W/O CM
3 series · 15 of 47 positions shown, 18 images · non-contrast
Comparison: 09/16/2011.

CLINICAL DATA: Code stroke. LEFT-sided numbness and dizziness since
earlier today.

EXAM:
CT HEAD WITHOUT CONTRAST
TECHNIQUE: Contiguous axial images were obtained from the base of the skull
through the vertex without intravenous contrast.

[Series 2: head w o · axial · 0.47mm/px · z∈[-18,+127]mm · 9 of 35 slices shown, 12 images]
[im 3/35  brain]
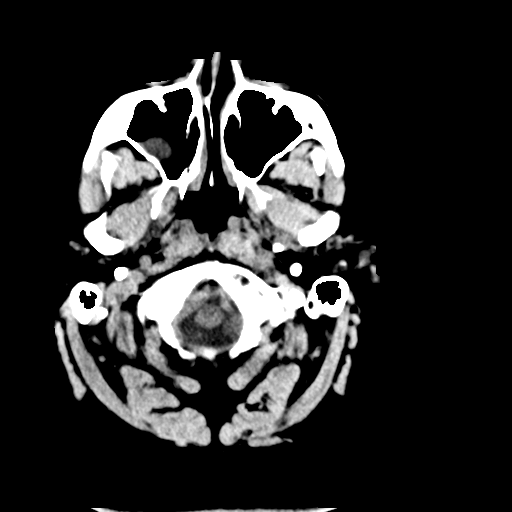
[im 3/35  bone]
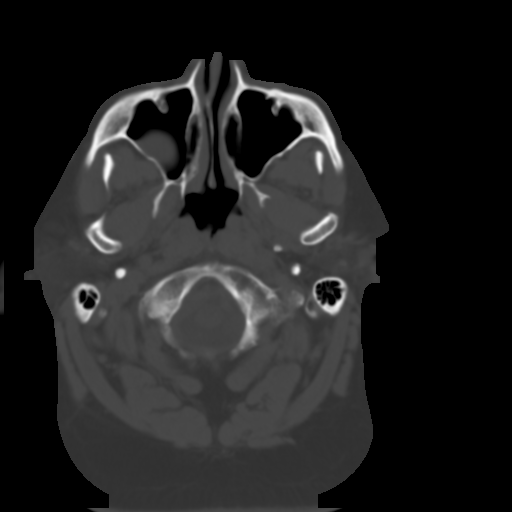
[im 6/35  brain]
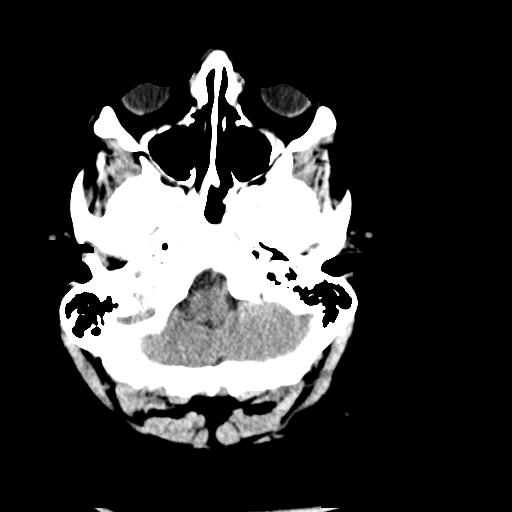
[im 10/35  brain]
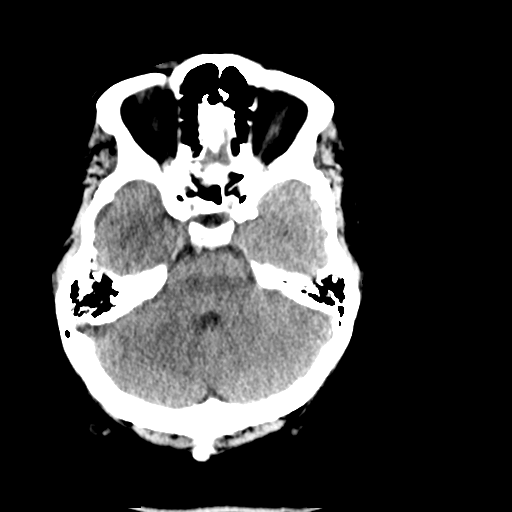
[im 13/35  brain]
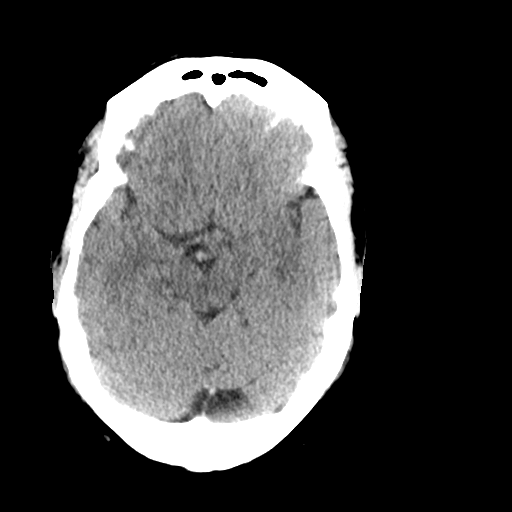
[im 18/35  brain]
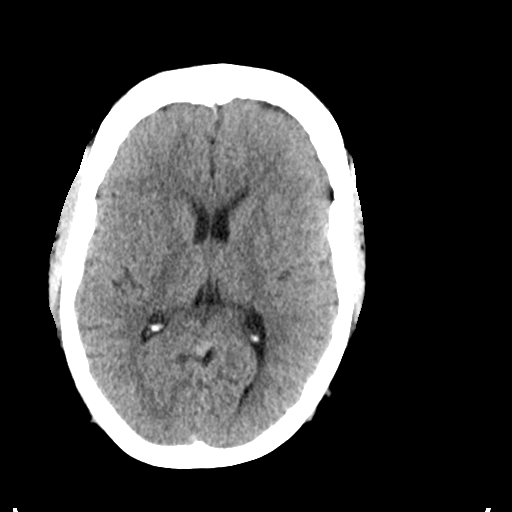
[im 18/35  bone]
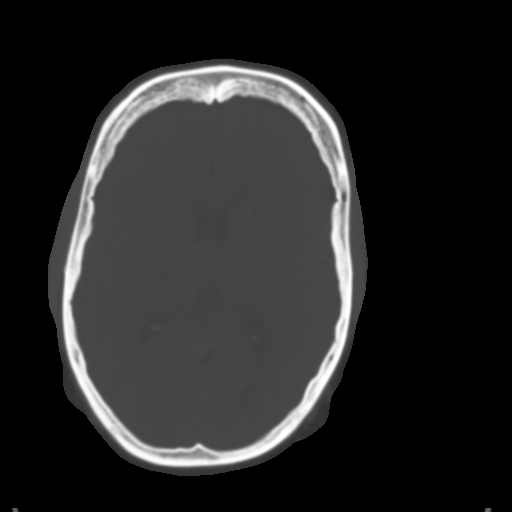
[im 22/35  brain]
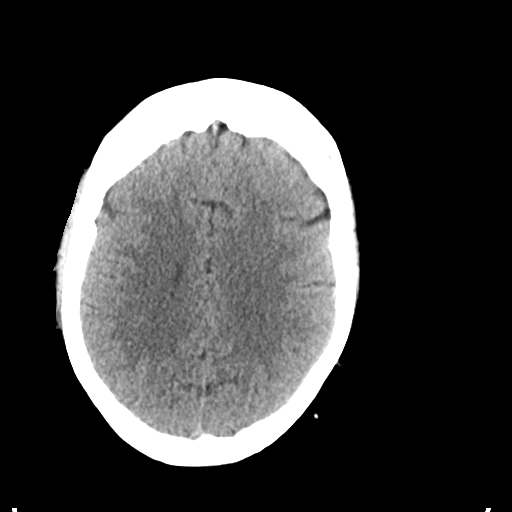
[im 25/35  brain]
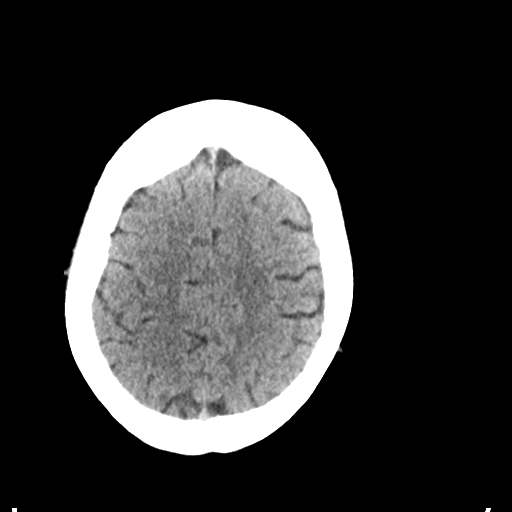
[im 29/35  brain]
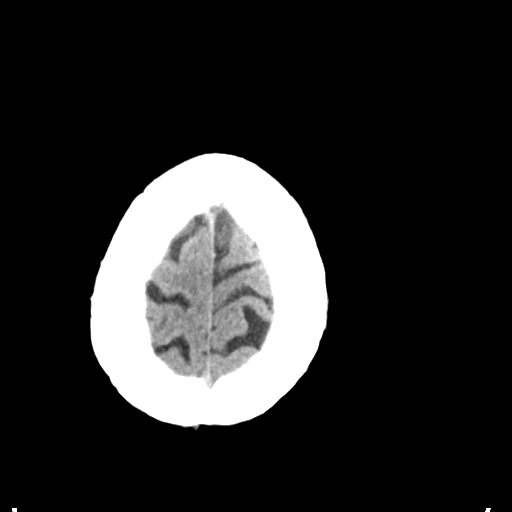
[im 32/35  brain]
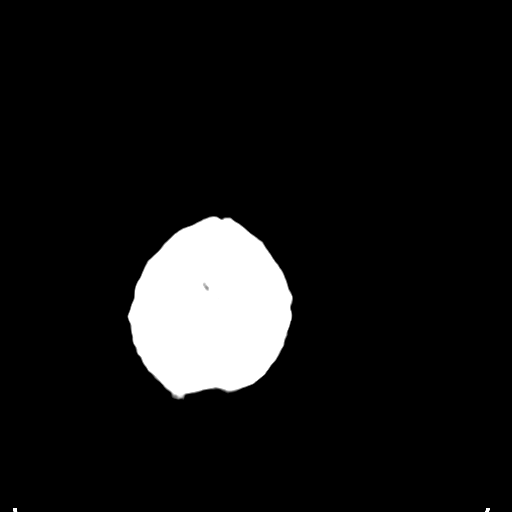
[im 32/35  bone]
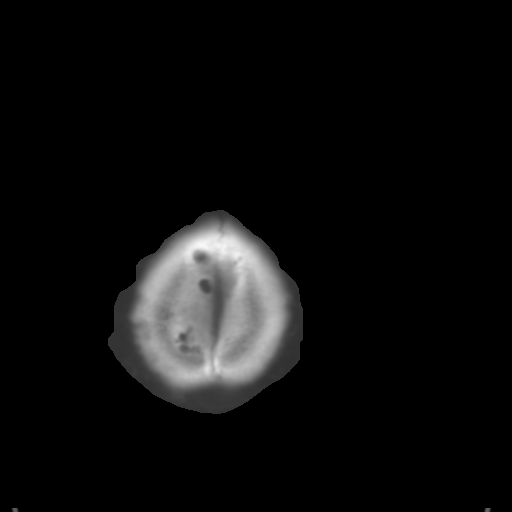

[Series 4: coronal soft · coronal · 0.35mm/px · 3 of 75 slices shown]
[im 25/75  brain]
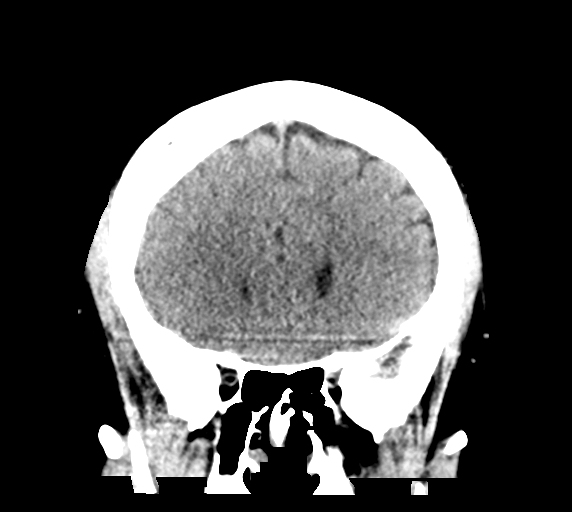
[im 33/75  brain]
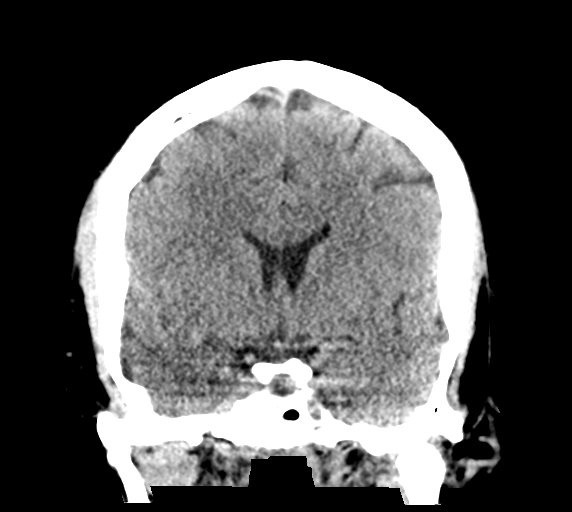
[im 42/75  brain]
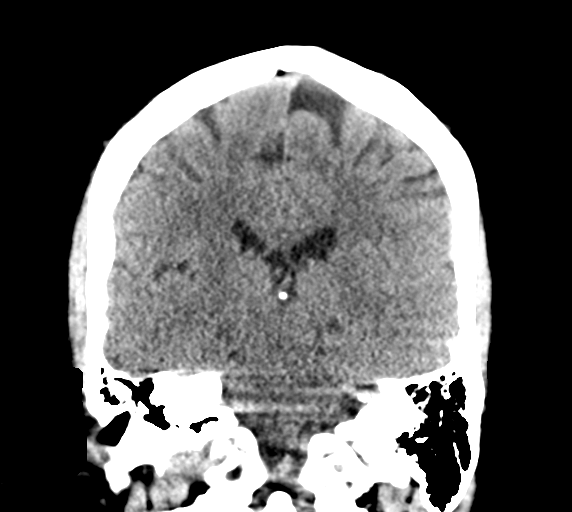

[Series 5: sagittal soft · sagittal · 0.40mm/px · 3 of 67 slices shown]
[im 23/67  brain]
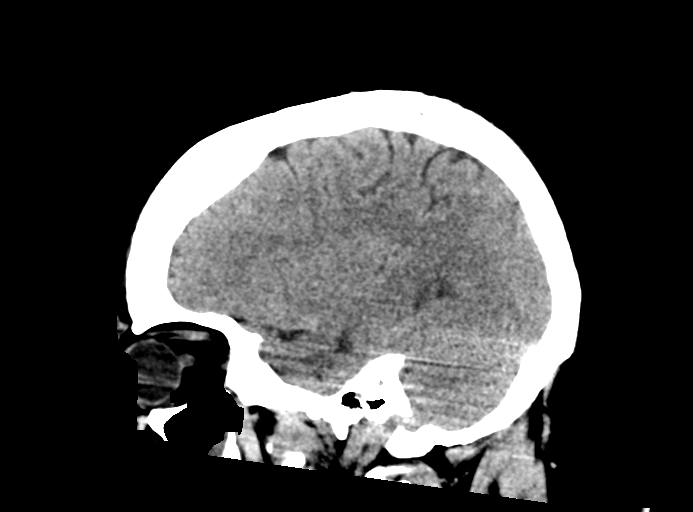
[im 34/67  brain]
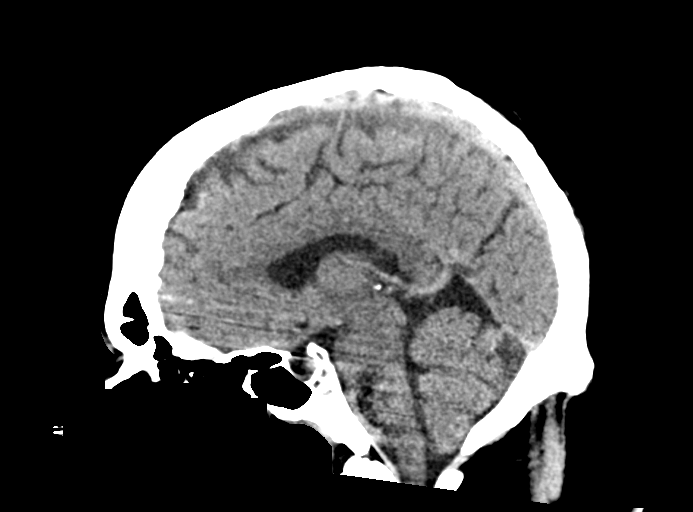
[im 45/67  brain]
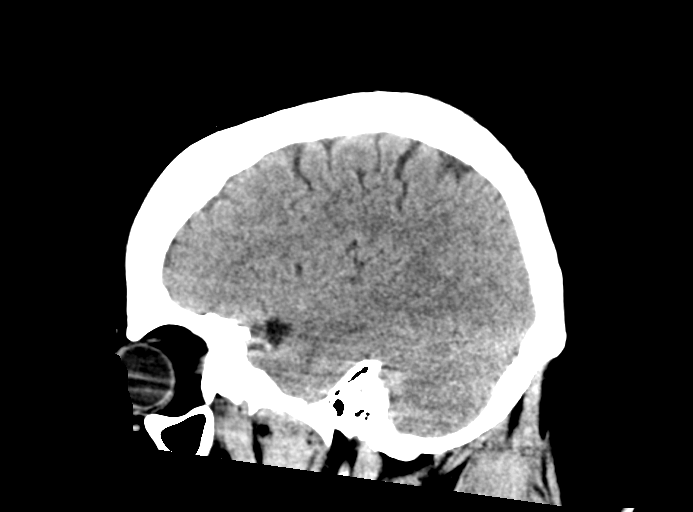

[15 of 47 positions shown; findings below may reference images not displayed]

FINDINGS: Brain: No evidence for acute infarction, hemorrhage, mass lesion,
hydrocephalus, or extra-axial fluid. Normal cerebral volume. No
white matter disease.

Vascular: No hyperdense vessel or unexpected calcification.

Skull: Normal. Negative for fracture or focal lesion.

Sinuses/Orbits: No acute finding.

Other: None.

ASPECTS (Alberta Stroke Program Early CT Score)

- Ganglionic level infarction (caudate, lentiform nuclei, internal
capsule, insula, M1-M3 cortex): 7

- Supraganglionic infarction (M4-M6 cortex): 3

Total score (0-10 with 10 being normal): 10
IMPRESSION: 1. Negative exam.
2. ASPECTS is 10.

These results were called by telephone at the time of interpretation
on 08/01/2018 at [DATE] to Dr. MARGARETTE TAI , who verbally
acknowledged these results.

*

## 2020-07-23 ENCOUNTER — Ambulatory Visit: Payer: BC Managed Care – PPO | Admitting: Family Medicine

## 2020-08-06 ENCOUNTER — Encounter: Payer: Self-pay | Admitting: Family Medicine

## 2020-08-06 ENCOUNTER — Ambulatory Visit (INDEPENDENT_AMBULATORY_CARE_PROVIDER_SITE_OTHER): Payer: BC Managed Care – PPO | Admitting: Family Medicine

## 2020-08-06 ENCOUNTER — Other Ambulatory Visit: Payer: Self-pay

## 2020-08-06 VITALS — BP 118/88 | HR 71 | Temp 97.0°F | Ht 66.5 in | Wt 297.0 lb

## 2020-08-06 DIAGNOSIS — E786 Lipoprotein deficiency: Secondary | ICD-10-CM | POA: Diagnosis not present

## 2020-08-06 DIAGNOSIS — Z1211 Encounter for screening for malignant neoplasm of colon: Secondary | ICD-10-CM | POA: Diagnosis not present

## 2020-08-06 DIAGNOSIS — Z0000 Encounter for general adult medical examination without abnormal findings: Secondary | ICD-10-CM

## 2020-08-06 DIAGNOSIS — Z0001 Encounter for general adult medical examination with abnormal findings: Secondary | ICD-10-CM

## 2020-08-06 DIAGNOSIS — R7989 Other specified abnormal findings of blood chemistry: Secondary | ICD-10-CM

## 2020-08-06 NOTE — Progress Notes (Signed)
   Subjective:    Patient ID: Jose Adkins, male    DOB: April 04, 1975, 45 y.o.   MRN: [id number]  HPI  Health check  Patient reports walking daily here for a regular check up  The patient comes in today for a wellness visit.    A review of their health history was completed.  A review of medications was also completed.  Any needed refills; no  Eating habits: Doing much better trying to eat healthy  Falls/  MVA accidents in past few months: Has not had any falls or accidents is a safe driver  Regular exercise: Is doing walking twice a day approximately 4 miles of walking plus also walking at work has lost significant amount of weight over the past 3 months  Specialist pt sees on regular basis: None  Preventative health issues were discussed.   Additional concerns: Mental health is doing very well job is not stressful things are going well at home  Review of Systems     Objective:   Physical Exam Vitals reviewed.  Constitutional:      General: He is not in acute distress. HENT:     Head: Normocephalic and atraumatic.  Eyes:     General:        Right eye: No discharge.        Left eye: No discharge.  Neck:     Trachea: No tracheal deviation.  Cardiovascular:     Rate and Rhythm: Normal rate and regular rhythm.     Heart sounds: Normal heart sounds. No murmur heard.   Pulmonary:     Effort: Pulmonary effort is normal. No respiratory distress.     Breath sounds: Normal breath sounds.  Lymphadenopathy:     Cervical: No cervical adenopathy.  Skin:    General: Skin is warm and dry.  Neurological:     Mental Status: He is alert.     Coordination: Coordination normal.  Psychiatric:        Behavior: Behavior normal.     Prostate exam not indicated      Assessment & Plan:  1. Well adult exam Adult wellness-complete.wellness physical was conducted today. Importance of diet and exercise were discussed in detail.  In addition to this a discussion regarding  safety was also covered. We also reviewed over immunizations and gave recommendations regarding current immunization needed for age.  In addition to this additional areas were also touched on including: Preventative health exams needed:  Colonoscopy recommend colonoscopy referral  Patient was advised yearly wellness exam  - Lipid panel - Comprehensive metabolic panel  2. Low HDL (under 40) Healthy diet check lab work await results - Lipid panel - Comprehensive metabolic panel

## 2020-08-10 NOTE — Addendum Note (Signed)
Addended by: Dairl Ponder on: 08/10/2020 09:15 AM   Modules accepted: Orders

## 2020-08-28 ENCOUNTER — Encounter: Payer: Self-pay | Admitting: *Deleted

## 2020-12-30 ENCOUNTER — Telehealth: Payer: Self-pay

## 2020-12-30 ENCOUNTER — Encounter: Payer: Self-pay | Admitting: Gastroenterology

## 2020-12-30 ENCOUNTER — Telehealth: Payer: Self-pay | Admitting: *Deleted

## 2020-12-30 ENCOUNTER — Telehealth (INDEPENDENT_AMBULATORY_CARE_PROVIDER_SITE_OTHER): Payer: BC Managed Care – PPO | Admitting: Gastroenterology

## 2020-12-30 ENCOUNTER — Other Ambulatory Visit: Payer: Self-pay

## 2020-12-30 DIAGNOSIS — Z1211 Encounter for screening for malignant neoplasm of colon: Secondary | ICD-10-CM

## 2020-12-30 MED ORDER — PEG 3350-KCL-NA BICARB-NACL 420 G PO SOLR: ORAL | 0 refills | Status: DC

## 2020-12-30 NOTE — Telephone Encounter (Signed)
Called pt. He has been scheduled for TCS with Dr. Abbey Chatters asa 3 on 10/20 at [facility] will send pre-op/instructions to [facility]. Rx will be sent to pharmacy.

## 2020-12-30 NOTE — Patient Instructions (Signed)
We are arranging a colonoscopy in the near future!  Further recommendations to follow!  It was a pleasure to see you today. I want to create trusting relationships with patients to provide genuine, compassionate, and quality care. I value your feedback. If you receive a survey regarding your visit,  I greatly appreciate you taking time to fill this out.   Nyah Shepherd W. Olie Scaffidi, PhD, ANP-BC [facility]   

## 2020-12-30 NOTE — H&P (View-Only) (Signed)
Referring Provider: Dr. Wolfgang Phoenix Primary Care Physician:  Kathyrn Drown, MD  Primary GI: Dr. Abbey Chatters  Patient Location: Home   Provider Location: [facility] office   Reason for Visit: Colonoscopy consultation   Persons present on the virtual encounter, with roles: Patient and NP   Total time (minutes) spent on medical discussion: 27minutes   Due to COVID-19, visit was conducted using virtual method.  Visit was requested by patient.  Virtual Visit via MyChart Video Note Due to COVID-19, visit is conducted virtually and was requested by patient.   I connected with Jose Adkins on 12/30/20 at 10:00 AM EDT by video and verified that I am speaking with the correct person using two identifiers.   I discussed the limitations, risks, security and privacy concerns of performing an evaluation and management service by telephone and the availability of in person appointments. I also discussed with the patient that there may be a patient responsible charge related to this service. The patient expressed understanding and agreed to proceed.  Chief Complaint  Patient presents with   consult TCS    Last done 20 years     History of Present Illness: 45 year old male presenting today virtually at request of Dr. Wolfgang Phoenix for colonoscopy. His last colonoscopy was 20 years ago in [place]. Unrevealing. No rectal bleeding. No constipation or diarrhea. No abdominal pain. He is on the [place]. Lost 75 lbs. Started in Dec 2020. Current weight 276.2. Ht 5 ft 7 inches. No GERD. No dysphagia. BMI 43.3.   No family history of colorectal cancer or polyps. No GI complaints today.   Past Medical History:  Diagnosis Date   Anxiety    Depression    Hypertension    Kidney stone    Kidney stones    Low testosterone    Reflux    Sleep apnea    CPAP     Past Surgical History:  Procedure Laterality Date   CYSTOSCOPY WITH RETROGRADE PYELOGRAM, URETEROSCOPY AND STENT PLACEMENT Left 09/09/2016    Procedure: CYSTOSCOPY WITH RETROGRADE PYELOGRAM, URETEROSCOPY AND STENT PLACEMENT, FIRST STAGE;  Surgeon: Alexis Frock, MD;  Location: [facility];  Service: Urology;  Laterality: Left;   CYSTOSCOPY WITH RETROGRADE PYELOGRAM, URETEROSCOPY AND STENT PLACEMENT Left 09/30/2016   Procedure: 2ND STAGE CYSTOSCOPY WITH RETROGRADE PYELOGRAM, URETEROSCOPY AND STENT EXCHANGE;  Surgeon: Alexis Frock, MD;  Location: [facility];  Service: Urology;  Laterality: Left;   HOLMIUM LASER APPLICATION Left 09/04/735   Procedure: HOLMIUM LASER APPLICATION;  Surgeon: Alexis Frock, MD;  Location: [facility];  Service: Urology;  Laterality: Left;   HOLMIUM LASER APPLICATION Left 03/26/2692   Procedure: HOLMIUM LASER APPLICATION;  Surgeon: Alexis Frock, MD;  Location: [facility];  Service: Urology;  Laterality: Left;   VASECTOMY  2002     Current Meds  Medication Sig   naproxen (NAPROSYN) 500 MG tablet TAKE 1 TABLET BY MOUTH TWICE DAILY WITH MEALS (Patient taking differently: As needed)     Family History  Problem Relation Age of Onset   Colon cancer Neg Hx    Colon polyps Neg Hx     Social History   Socioeconomic History   Marital status: Married    Spouse name: Not on file   Number of children: 2   Years of education: Not on file   Highest education level: Not on file  Occupational History   Not on file  Tobacco Use   Smoking status: Never   Smokeless tobacco: Never  Vaping Use  Vaping Use: Never used  Substance and Sexual Activity   Alcohol use: No   Drug use: No   Sexual activity: Not on file  Other Topics Concern   Not on file  Social History Narrative   Not on file   Social Determinants of Health   Financial Resource Strain: Not on file  Food Insecurity: Not on file  Transportation Needs: Not on file  Physical Activity: Not on file  Stress: Not on file  Social Connections: Not on file       Review of Systems: Gen: Denies fever, chills, anorexia. Denies fatigue, weakness, weight loss.   CV: Denies chest pain, palpitations, syncope, peripheral edema, and claudication. Resp: Denies dyspnea at rest, cough, wheezing, coughing up blood, and pleurisy. GI: see HPI Derm: Denies rash, itching, dry skin Psych: Denies depression, anxiety, memory loss, confusion. No homicidal or suicidal ideation.  Heme: Denies bruising, bleeding, and enlarged lymph nodes.  Observations/Objective: No distress. Unable to perform physical exam due to video encounter.   Assessment and Plan: 45 year old male presenting today for screening colonoscopy, with last done over 20 years ago in [place] that was unrevealing. No concerning lower or upper GI signs or symptoms. Due to BMI, will be ASA 3. (BMI 43.3). No family history of colorectal cancer or polyps.  Proceed with colonoscopy by Dr. Abbey Chatters  in near future: the risks, benefits, and alternatives have been discussed with the patient in detail. The patient states understanding and desires to proceed.  ASA 3 due to BMI  Follow Up Instructions:    I discussed the assessment and treatment plan with the patient. The patient was provided an opportunity to ask questions and all were answered. The patient agreed with the plan and demonstrated an understanding of the instructions.   The patient was advised to call back or seek an in-person evaluation if the symptoms worsen or if the condition fails to improve as anticipated.  I provided 10 minutes of face-to-face time during this MyChart Video encounter.  Annitta Needs, PhD, ANP-BC [facility]

## 2020-12-30 NOTE — Telephone Encounter (Signed)
Jose Adkins, you are scheduled for a virtual visit with your provider today.  Just as we do with appointments in the office, we must obtain your consent to participate.  Your consent will be active for this visit and any virtual visit you may have with one of our providers in the next 365 days.  If you have a MyChart account, I can also send a copy of this consent to you electronically.  All virtual visits are billed to your insurance company just like a traditional visit in the office.  As this is a virtual visit, video technology does not allow for your provider to perform a traditional examination.  This may limit your provider's ability to fully assess your condition.  If your provider identifies any concerns that need to be evaluated in person or the need to arrange testing such as labs, EKG, etc, we will make arrangements to do so.  Although advances in technology are sophisticated, we cannot ensure that it will always work on either your end or our end.  If the connection with a video visit is poor, we may have to switch to a telephone visit.  With either a video or telephone visit, we are not always able to ensure that we have a secure connection.   I need to obtain your verbal consent now.   Are you willing to proceed with your visit today?

## 2020-12-30 NOTE — Telephone Encounter (Signed)
Pt consented to a virtual visit. 

## 2020-12-30 NOTE — Telephone Encounter (Signed)
Pt's AVS mailed

## 2020-12-30 NOTE — Progress Notes (Signed)
Referring Provider: Dr. Wolfgang Phoenix Primary Care Physician:  Kathyrn Drown, MD  Primary GI: Dr. Abbey Chatters  Patient Location: Home   Provider Location: [facility] office   Reason for Visit: Colonoscopy consultation   Persons present on the virtual encounter, with roles: Patient and NP   Total time (minutes) spent on medical discussion: 21minutes   Due to COVID-19, visit was conducted using virtual method.  Visit was requested by patient.  Virtual Visit via MyChart Video Note Due to COVID-19, visit is conducted virtually and was requested by patient.   I connected with Jose Adkins on 12/30/20 at 10:00 AM EDT by video and verified that I am speaking with the correct person using two identifiers.   I discussed the limitations, risks, security and privacy concerns of performing an evaluation and management service by telephone and the availability of in person appointments. I also discussed with the patient that there may be a patient responsible charge related to this service. The patient expressed understanding and agreed to proceed.  Chief Complaint  Patient presents with   consult TCS    Last done 20 years     History of Present Illness: 45 year old male presenting today virtually at request of Dr. Wolfgang Phoenix for colonoscopy. His last colonoscopy was 20 years ago in [place]. Unrevealing. No rectal bleeding. No constipation or diarrhea. No abdominal pain. He is on the [place]. Lost 75 lbs. Started in Dec 2020. Current weight 276.2. Ht 5 ft 7 inches. No GERD. No dysphagia. BMI 43.3.   No family history of colorectal cancer or polyps. No GI complaints today.   Past Medical History:  Diagnosis Date   Anxiety    Depression    Hypertension    Kidney stone    Kidney stones    Low testosterone    Reflux    Sleep apnea    CPAP     Past Surgical History:  Procedure Laterality Date   CYSTOSCOPY WITH RETROGRADE PYELOGRAM, URETEROSCOPY AND STENT PLACEMENT Left 09/09/2016    Procedure: CYSTOSCOPY WITH RETROGRADE PYELOGRAM, URETEROSCOPY AND STENT PLACEMENT, FIRST STAGE;  Surgeon: Alexis Frock, MD;  Location: [facility];  Service: Urology;  Laterality: Left;   CYSTOSCOPY WITH RETROGRADE PYELOGRAM, URETEROSCOPY AND STENT PLACEMENT Left 09/30/2016   Procedure: 2ND STAGE CYSTOSCOPY WITH RETROGRADE PYELOGRAM, URETEROSCOPY AND STENT EXCHANGE;  Surgeon: Alexis Frock, MD;  Location: [facility];  Service: Urology;  Laterality: Left;   HOLMIUM LASER APPLICATION Left 5/80/9983   Procedure: HOLMIUM LASER APPLICATION;  Surgeon: Alexis Frock, MD;  Location: [facility];  Service: Urology;  Laterality: Left;   HOLMIUM LASER APPLICATION Left 3/82/5053   Procedure: HOLMIUM LASER APPLICATION;  Surgeon: Alexis Frock, MD;  Location: [facility];  Service: Urology;  Laterality: Left;   VASECTOMY  2002     Current Meds  Medication Sig   naproxen (NAPROSYN) 500 MG tablet TAKE 1 TABLET BY MOUTH TWICE DAILY WITH MEALS (Patient taking differently: As needed)     Family History  Problem Relation Age of Onset   Colon cancer Neg Hx    Colon polyps Neg Hx     Social History   Socioeconomic History   Marital status: Married    Spouse name: Not on file   Number of children: 2   Years of education: Not on file   Highest education level: Not on file  Occupational History   Not on file  Tobacco Use   Smoking status: Never   Smokeless tobacco: Never  Vaping Use  Vaping Use: Never used  Substance and Sexual Activity   Alcohol use: No   Drug use: No   Sexual activity: Not on file  Other Topics Concern   Not on file  Social History Narrative   Not on file   Social Determinants of Health   Financial Resource Strain: Not on file  Food Insecurity: Not on file  Transportation Needs: Not on file  Physical Activity: Not on file  Stress: Not on file  Social Connections: Not on file       Review of Systems: Gen: Denies fever, chills, anorexia. Denies fatigue, weakness, weight loss.   CV: Denies chest pain, palpitations, syncope, peripheral edema, and claudication. Resp: Denies dyspnea at rest, cough, wheezing, coughing up blood, and pleurisy. GI: see HPI Derm: Denies rash, itching, dry skin Psych: Denies depression, anxiety, memory loss, confusion. No homicidal or suicidal ideation.  Heme: Denies bruising, bleeding, and enlarged lymph nodes.  Observations/Objective: No distress. Unable to perform physical exam due to video encounter.   Assessment and Plan: 45 year old male presenting today for screening colonoscopy, with last done over 20 years ago in [place] that was unrevealing. No concerning lower or upper GI signs or symptoms. Due to BMI, will be ASA 3. (BMI 43.3). No family history of colorectal cancer or polyps.  Proceed with colonoscopy by Dr. Abbey Chatters  in near future: the risks, benefits, and alternatives have been discussed with the patient in detail. The patient states understanding and desires to proceed.  ASA 3 due to BMI  Follow Up Instructions:    I discussed the assessment and treatment plan with the patient. The patient was provided an opportunity to ask questions and all were answered. The patient agreed with the plan and demonstrated an understanding of the instructions.   The patient was advised to call back or seek an in-person evaluation if the symptoms worsen or if the condition fails to improve as anticipated.  I provided 10 minutes of face-to-face time during this MyChart Video encounter.  Annitta Needs, PhD, ANP-BC [facility]

## 2021-01-05 ENCOUNTER — Encounter (HOSPITAL_COMMUNITY): Payer: BC Managed Care – PPO

## 2021-01-13 NOTE — Patient Instructions (Signed)
Jose Adkins  01/13/2021     @PREFPERIOPPHARMACY @   Your procedure is scheduled on 01/18/2021.   Report to [facility] at 0900 A.M.   Call this number if you have problems the morning of surgery:  [phone]   Remember:       Follow the diet and prep instructions given to you by the office.    Take these medicines the morning of surgery with A SIP OF WATER                                    None     Do not wear jewelry, make-up or nail polish.  Do not wear lotions, powders, or perfumes, or deodorant.  Do not shave 48 hours prior to surgery.  Men may shave face and neck.  Do not bring valuables to the hospital.  [facility] is not responsible for any belongings or valuables.  Contacts, dentures or bridgework may not be worn into surgery.  Leave your suitcase in the car.  After surgery it may be brought to your room.  For patients admitted to the hospital, discharge time will be determined by your treatment team.  Patients discharged the day of surgery will not be allowed to drive home and must have someone with them for 24 hours.    Special instructions:  DO NOT smoke tobacco or vape for 24 hours before your procedure.  Please read over the following fact sheets that you were given. Anesthesia Post-op Instructions and Care and Recovery After Surgery      Colonoscopy, Adult, Care After This sheet gives you information about how to care for yourself after your procedure. Your health care provider may also give you more specific instructions. If you have problems or questions, contact your health care provider. What can I expect after the procedure? After the procedure, it is common to have: A small amount of blood in your stool for 24 hours after the procedure. Some gas. Mild cramping or bloating of your abdomen. Follow these instructions at home: Eating and drinking  Drink enough fluid to keep your urine pale yellow. Follow instructions from your  health care provider about eating or drinking restrictions. Resume your normal diet as instructed by your health care provider. Avoid heavy or fried foods that are hard to digest. Activity Rest as told by your health care provider. Avoid sitting for a long time without moving. Get up to take short walks every 1-2 hours. This is important to improve blood flow and breathing. Ask for help if you feel weak or unsteady. Return to your normal activities as told by your health care provider. Ask your health care provider what activities are safe for you. Managing cramping and bloating  Try walking around when you have cramps or feel bloated. Apply heat to your abdomen as told by your health care provider. Use the heat source that your health care provider recommends, such as a moist heat pack or a heating pad. Place a towel between your skin and the heat source. Leave the heat on for 20-30 minutes. Remove the heat if your skin turns bright red. This is especially important if you are unable to feel pain, heat, or cold. You may have a greater risk of getting burned. General instructions If you were given a sedative during the procedure, it can affect you for several hours. Do  not drive or operate machinery until your health care provider says that it is safe. For the first 24 hours after the procedure: Do not sign important documents. Do not drink alcohol. Do your regular daily activities at a slower pace than normal. Eat soft foods that are easy to digest. Take over-the-counter and prescription medicines only as told by your health care provider. Keep all follow-up visits as told by your health care provider. This is important. Contact a health care provider if: You have blood in your stool 2-3 days after the procedure. Get help right away if you have: More than a small spotting of blood in your stool. Large blood clots in your stool. Swelling of your abdomen. Nausea or vomiting. A  fever. Increasing pain in your abdomen that is not relieved with medicine. Summary After the procedure, it is common to have a small amount of blood in your stool. You may also have mild cramping and bloating of your abdomen. If you were given a sedative during the procedure, it can affect you for several hours. Do not drive or operate machinery until your health care provider says that it is safe. Get help right away if you have a lot of blood in your stool, nausea or vomiting, a fever, or increased pain in your abdomen. This information is not intended to replace advice given to you by your health care provider. Make sure you discuss any questions you have with your health care provider. Document Revised: 03/01/2019 Document Reviewed: 10/01/2018 Elsevier Patient Education  [place] After This sheet gives you information about how to care for yourself after your procedure. Your health care provider may also give you more specific instructions. If you have problems or questions, contact your health care provider. What can I expect after the procedure? After the procedure, it is common to have: Tiredness. Forgetfulness about what happened after the procedure. Impaired judgment for important decisions. Nausea or vomiting. Some difficulty with balance. Follow these instructions at home: For the time period you were told by your health care provider:   Rest as needed. Do not participate in activities where you could fall or become injured. Do not drive or use machinery. Do not drink alcohol. Do not take sleeping pills or medicines that cause drowsiness. Do not make important decisions or sign legal documents. Do not take care of children on your own. Eating and drinking Follow the diet that is recommended by your health care provider. Drink enough fluid to keep your urine pale yellow. If you vomit: Drink water, juice, or soup when you can drink  without vomiting. Make sure you have little or no nausea before eating solid foods. General instructions Have a responsible adult stay with you for the time you are told. It is important to have someone help care for you until you are awake and alert. Take over-the-counter and prescription medicines only as told by your health care provider. If you have sleep apnea, surgery and certain medicines can increase your risk for breathing problems. Follow instructions from your health care provider about wearing your sleep device: Anytime you are sleeping, including during daytime naps. While taking prescription pain medicines, sleeping medicines, or medicines that make you drowsy. Avoid smoking. Keep all follow-up visits as told by your health care provider. This is important. Contact a health care provider if: You keep feeling nauseous or you keep vomiting. You feel light-headed. You are still sleepy or having trouble with balance after  24 hours. You develop a rash. You have a fever. You have redness or swelling around the IV site. Get help right away if: You have trouble breathing. You have new-onset confusion at home. Summary For several hours after your procedure, you may feel tired. You may also be forgetful and have poor judgment. Have a responsible adult stay with you for the time you are told. It is important to have someone help care for you until you are awake and alert. Rest as told. Do not drive or operate machinery. Do not drink alcohol or take sleeping pills. Get help right away if you have trouble breathing, or if you suddenly become confused. This information is not intended to replace advice given to you by your health care provider. Make sure you discuss any questions you have with your health care provider. Document Revised: 11/21/2019 Document Reviewed: 02/07/2019 Elsevier Patient Education  2022 [employer].

## 2021-01-14 ENCOUNTER — Encounter (HOSPITAL_COMMUNITY)
Admission: RE | Admit: 2021-01-14 | Discharge: 2021-01-14 | Disposition: A | Discharge status: Home / Self Care | Payer: BC Managed Care – PPO | Source: Ambulatory Visit | Attending: Internal Medicine | Admitting: Internal Medicine

## 2021-01-14 DIAGNOSIS — E786 Lipoprotein deficiency: Secondary | ICD-10-CM | POA: Diagnosis not present

## 2021-01-14 DIAGNOSIS — Z01810 Encounter for preprocedural cardiovascular examination: Secondary | ICD-10-CM | POA: Insufficient documentation

## 2021-01-14 DIAGNOSIS — Z0000 Encounter for general adult medical examination without abnormal findings: Secondary | ICD-10-CM | POA: Diagnosis not present

## 2021-01-15 LAB — COMPREHENSIVE METABOLIC PANEL
ALT: 22 IU/L (ref 0–44)
AST: 26 IU/L (ref 0–40)
Albumin/Globulin Ratio: 1.8 (ref 1.2–2.2)
Albumin: 4.7 g/dL (ref 4.0–5.0)
Alkaline Phosphatase: 81 IU/L (ref 44–121)
BUN/Creatinine Ratio: 13 (ref 9–20)
BUN: 18 mg/dL (ref 6–24)
Bilirubin Total: 0.6 mg/dL (ref 0.0–1.2)
CO2: 25 mmol/L (ref 20–29)
Calcium: 9.7 mg/dL (ref 8.7–10.2)
Chloride: 104 mmol/L (ref 96–106)
Creatinine, Ser: 1.4 mg/dL — ABNORMAL HIGH (ref 0.76–1.27)
Globulin, Total: 2.6 g/dL (ref 1.5–4.5)
Glucose: 88 mg/dL (ref 70–99)
Potassium: 4.7 mmol/L (ref 3.5–5.2)
Sodium: 142 mmol/L (ref 134–144)
Total Protein: 7.3 g/dL (ref 6.0–8.5)
eGFR: 63 mL/min/{1.73_m2} (ref >59)

## 2021-01-15 LAB — LIPID PANEL
Chol/HDL Ratio: 3.5 ratio (ref 0.0–5.0)
Cholesterol, Total: 144 mg/dL (ref 100–199)
HDL: 41 mg/dL (ref >39)
LDL Chol Calc (NIH): 91 mg/dL (ref 0–99)
Triglycerides: 58 mg/dL (ref 0–149)
VLDL Cholesterol Cal: 12 mg/dL (ref 5–40)

## 2021-01-18 ENCOUNTER — Ambulatory Visit (HOSPITAL_COMMUNITY)
Admission: RE | Admit: 2021-01-18 | Discharge: 2021-01-18 | Disposition: A | Discharge status: Home / Self Care | Payer: BC Managed Care – PPO | Attending: Internal Medicine | Admitting: Internal Medicine

## 2021-01-18 ENCOUNTER — Encounter (HOSPITAL_COMMUNITY)
Admission: RE | Disposition: A | Discharge status: Home / Self Care | Payer: Self-pay | Source: Home / Self Care | Attending: Internal Medicine

## 2021-01-18 ENCOUNTER — Encounter (HOSPITAL_COMMUNITY): Payer: Self-pay

## 2021-01-18 ENCOUNTER — Ambulatory Visit (HOSPITAL_COMMUNITY): Payer: BC Managed Care – PPO | Admitting: Anesthesiology

## 2021-01-18 DIAGNOSIS — Z1211 Encounter for screening for malignant neoplasm of colon: Secondary | ICD-10-CM | POA: Insufficient documentation

## 2021-01-18 DIAGNOSIS — Z6841 Body Mass Index (BMI) 40.0 and over, adult: Secondary | ICD-10-CM | POA: Diagnosis not present

## 2021-01-18 DIAGNOSIS — K6389 Other specified diseases of intestine: Secondary | ICD-10-CM | POA: Diagnosis not present

## 2021-01-18 DIAGNOSIS — K648 Other hemorrhoids: Secondary | ICD-10-CM | POA: Diagnosis not present

## 2021-01-18 DIAGNOSIS — Z791 Long term (current) use of non-steroidal anti-inflammatories (NSAID): Secondary | ICD-10-CM | POA: Diagnosis not present

## 2021-01-18 DIAGNOSIS — K5140 Inflammatory polyps of colon without complications: Secondary | ICD-10-CM | POA: Insufficient documentation

## 2021-01-18 DIAGNOSIS — D122 Benign neoplasm of ascending colon: Secondary | ICD-10-CM | POA: Diagnosis not present

## 2021-01-18 DIAGNOSIS — K635 Polyp of colon: Secondary | ICD-10-CM | POA: Diagnosis not present

## 2021-01-18 DIAGNOSIS — G4733 Obstructive sleep apnea (adult) (pediatric): Secondary | ICD-10-CM | POA: Diagnosis not present

## 2021-01-18 HISTORY — PX: POLYPECTOMY: SHX5525

## 2021-01-18 HISTORY — PX: COLONOSCOPY WITH PROPOFOL: SHX5780

## 2021-01-18 SURGERY — COLONOSCOPY WITH PROPOFOL
Anesthesia: General

## 2021-01-18 MED ORDER — EPHEDRINE 5 MG/ML INJ
INTRAVENOUS | Status: AC
  Filled 2021-01-18: qty 5

## 2021-01-18 MED ORDER — LIDOCAINE HCL (CARDIAC) PF 100 MG/5ML IV SOSY
PREFILLED_SYRINGE | INTRAVENOUS | Status: DC | PRN
  Administered 2021-01-18: 50 mg via INTRATRACHEAL

## 2021-01-18 MED ORDER — PROPOFOL 10 MG/ML IV BOLUS
INTRAVENOUS | Status: DC | PRN
  Administered 2021-01-18: 30 mg via INTRAVENOUS
  Administered 2021-01-18: 120 mg via INTRAVENOUS
  Administered 2021-01-18 (×2): 40 mg via INTRAVENOUS
  Administered 2021-01-18: 30 mg via INTRAVENOUS
  Administered 2021-01-18: 40 mg via INTRAVENOUS

## 2021-01-18 MED ORDER — LACTATED RINGERS IV SOLN: INTRAVENOUS | Status: DC

## 2021-01-18 MED ORDER — EPHEDRINE SULFATE 50 MG/ML IJ SOLN
INTRAMUSCULAR | Status: DC | PRN
  Administered 2021-01-18 (×2): 10 mg via INTRAVENOUS
  Administered 2021-01-18: 5 mg via INTRAVENOUS

## 2021-01-18 MED ORDER — LIDOCAINE HCL (PF) 2 % IJ SOLN
INTRAMUSCULAR | Status: AC
  Filled 2021-01-18: qty 15

## 2021-01-18 MED ORDER — PROPOFOL 1000 MG/100ML IV EMUL
INTRAVENOUS | Status: AC
  Filled 2021-01-18: qty 300

## 2021-01-18 NOTE — Transfer of Care (Signed)
Immediate Anesthesia Transfer of Care Note  Patient: [name]  Procedure(s) Performed: COLONOSCOPY WITH PROPOFOL POLYPECTOMY  Patient Location: Short Stay  Anesthesia Type:General  Level of Consciousness: awake and alert   Airway & Oxygen Therapy: Patient Spontanous Breathing  Post-op Assessment: Report given to RN and Post -op Vital signs reviewed and stable  Post vital signs: Reviewed and stable  Last Vitals:  Vitals Value Taken Time  BP 103/76 01/18/21 1100  Temp 36.9 C 01/18/21 1100  Pulse 74 01/18/21 1100  Resp 18 01/18/21 1100  SpO2 100 % 01/18/21 1100    Last Pain:  Vitals:   01/18/21 1100  TempSrc: Oral  PainSc: 0-No pain      Patients Stated Pain Goal: 6 (60/15/61 5379)  Complications: No notable events documented.

## 2021-01-18 NOTE — Interval H&P Note (Signed)
History and Physical Interval Note:  01/18/2021 9:51 AM  Jose Adkins  has presented today for surgery, with the diagnosis of SCREENING.  The various methods of treatment have been discussed with the patient and family. After consideration of risks, benefits and other options for treatment, the patient has consented to  Procedure(s) with comments: COLONOSCOPY WITH PROPOFOL (N/A) - 8:00am as a surgical intervention.  The patient's history has been reviewed, patient examined, no change in status, stable for surgery.  I have reviewed the patient's chart and labs.  Questions were answered to the patient's satisfaction.     Eloise Harman

## 2021-01-18 NOTE — Addendum Note (Signed)
Addended by: Dairl Ponder on: 01/18/2021 11:42 AM   Modules accepted: Orders

## 2021-01-18 NOTE — Anesthesia Preprocedure Evaluation (Signed)
Anesthesia Evaluation  Patient identified by MRN, date of birth, ID band Patient awake    Reviewed: Allergy & Precautions, H&P , NPO status , Patient's Chart, lab work & pertinent test results, reviewed documented beta blocker date and time   Airway Mallampati: II  TM Distance: >3 FB Neck ROM: full    Dental no notable dental hx.    Pulmonary sleep apnea and Continuous Positive Airway Pressure Ventilation ,    Pulmonary exam normal breath sounds clear to auscultation       Cardiovascular Exercise Tolerance: Good hypertension, negative cardio ROS   Rhythm:regular Rate:Normal     Neuro/Psych PSYCHIATRIC DISORDERS Anxiety Depression negative neurological ROS     GI/Hepatic negative GI ROS, Neg liver ROS,   Endo/Other  Morbid obesity  Renal/GU Renal disease  negative genitourinary   Musculoskeletal   Abdominal   Peds  Hematology negative hematology ROS (+)   Anesthesia Other Findings   Reproductive/Obstetrics negative OB ROS                             Anesthesia Physical Anesthesia Plan  ASA: 3  Anesthesia Plan: General   Post-op Pain Management:    Induction:   PONV Risk Score and Plan: Propofol infusion  Airway Management Planned:   Additional Equipment:   Intra-op Plan:   Post-operative Plan:   Informed Consent: I have reviewed the patients History and Physical, chart, labs and discussed the procedure including the risks, benefits and alternatives for the proposed anesthesia with the patient or authorized representative who has indicated his/her understanding and acceptance.     Dental Advisory Given  Plan Discussed with: CRNA  Anesthesia Plan Comments:         Anesthesia Quick Evaluation

## 2021-01-18 NOTE — Anesthesia Postprocedure Evaluation (Signed)
Anesthesia Post Note  Patient: Jose Adkins  Procedure(s) Performed: COLONOSCOPY WITH PROPOFOL POLYPECTOMY  Patient location during evaluation: Phase II Anesthesia Type: General Level of consciousness: awake Pain management: pain level controlled Vital Signs Assessment: post-procedure vital signs reviewed and stable Respiratory status: spontaneous breathing and respiratory function stable Cardiovascular status: blood pressure returned to baseline and stable Postop Assessment: no headache and no apparent nausea or vomiting Anesthetic complications: no Comments: Late entry   No notable events documented.   Last Vitals:  Vitals:   01/18/21 1030 01/18/21 1100  BP:  103/76  Pulse:  74  Resp: 15 18  Temp:  36.9 C  SpO2:  100%    Last Pain:  Vitals:   01/18/21 1100  TempSrc: Oral  PainSc: 0-No pain                 Louann Sjogren

## 2021-01-18 NOTE — Op Note (Signed)
[facility] Patient Name: Jose Adkins Procedure Date: 01/18/2021 10:14 AM MRN: [id number] Date of Birth: 24-Feb-1976 Attending MD: Elon Alas. Abbey Chatters DO CSN: [id number] Age: 45 Admit Type: Outpatient Procedure:                Colonoscopy Indications:              Screening for colorectal malignant neoplasm Providers:                Elon Alas. Abbey Chatters, DO, Charlsie Quest. Theda Sers RN, RN,                            Randa Spike, Technician Referring MD:              Medicines:                See the Anesthesia note for documentation of the                            administered medications Complications:            No immediate complications. Estimated Blood Loss:     Estimated blood loss was minimal. Procedure:                Pre-Anesthesia Assessment:                           - The anesthesia plan was to use monitored                            anesthesia care (MAC).                           After obtaining informed consent, the colonoscope                            was passed under direct vision. Throughout the                            procedure, the patient's blood pressure, pulse, and                            oxygen saturations were monitored continuously. The                            [id number]-HQ190L ([id number]) scope was introduced through                            the anus and advanced to the the cecum, identified                            by appendiceal orifice and ileocecal valve. The                            colonoscopy was technically difficult and complex                            due to a redundant  colon and significant looping.                            Successful completion of the procedure was aided by                            changing the patient to a supine position and                            applying abdominal pressure. The patient tolerated                            the procedure well. The quality of the bowel                             preparation was evaluated using the BBPS [facility]                            Bowel Preparation Scale) with scores of: Right                            Colon = 3, Transverse Colon = 3 and Left Colon = 3                            (entire mucosa seen well with no residual staining,                            small fragments of stool or opaque liquid). The                            total BBPS score equals 9. Scope In: 10:40:54 AM Scope Out: 10:58:17 AM Scope Withdrawal Time: 0 hours 8 minutes 13 seconds  Total Procedure Duration: 0 hours 17 minutes 23 seconds  Findings:      The perianal and digital rectal examinations were normal.      Non-bleeding internal hemorrhoids were found during endoscopy.      A 3 mm polyp was found in the ascending colon. The polyp was sessile.       The polyp was removed with a cold snare. Resection and retrieval were       complete.      The colon (entire examined portion) revealed moderately excessive       looping. Advancing the scope required changing the patient to a supine       position and using manual pressure.      The exam was otherwise without abnormality. Impression:               - Non-bleeding internal hemorrhoids.                           - One 3 mm polyp in the ascending colon, removed                            with a cold snare. Resected and retrieved.                           -  There was significant looping of the colon.                           - The examination was otherwise normal. Moderate Sedation:      Per Anesthesia Care Recommendation:           - Patient has a contact number available for                            emergencies. The signs and symptoms of potential                            delayed complications were discussed with the                            patient. Return to normal activities tomorrow.                            Written discharge instructions were provided to the                            patient.                            - Resume previous diet.                           - Continue present medications.                           - Await pathology results.                           - Repeat colonoscopy in 5 years for surveillance.                           - Return to GI clinic PRN. Procedure Code(s):        --- Professional ---                           [phone], Colonoscopy, flexible; with removal of                            tumor(s), polyp(s), or other lesion(s) by snare                            technique Diagnosis Code(s):        --- Professional ---                           Z12.11, Encounter for screening for malignant                            neoplasm of colon                           K63.5, Polyp of colon  K64.8, Other hemorrhoids CPT copyright 2019 American Medical Association. All rights reserved. The codes documented in this report are preliminary and upon coder review may  be revised to meet current compliance requirements. Elon Alas. Abbey Chatters, DO [place], DO 01/18/2021 11:00:28 AM This report has been signed electronically. Number of Addenda: 0

## 2021-01-18 NOTE — Discharge Instructions (Addendum)

## 2021-01-19 LAB — SURGICAL PATHOLOGY

## 2021-01-20 ENCOUNTER — Encounter (HOSPITAL_COMMUNITY): Payer: Self-pay | Admitting: Internal Medicine

## 2021-01-21 NOTE — Interval H&P Note (Signed)
History and Physical Interval Note:  01/21/2021 2:02 PM  Jose Adkins  has presented today for surgery, with the diagnosis of SCREENING.  The various methods of treatment have been discussed with the patient and family. After consideration of risks, benefits and other options for treatment, the patient has consented to  Procedure(s) with comments: COLONOSCOPY WITH PROPOFOL (N/A) - 8:00am POLYPECTOMY as a surgical intervention.  The patient's history has been reviewed, patient examined, no change in status, stable for surgery.  I have reviewed the patient's chart and labs.  Questions were answered to the patient's satisfaction.     Eloise Harman    General:   Alert,  Well-developed, well-nourished, pleasant and cooperative in NAD Head:  Normocephalic and atraumatic. Eyes:  Sclera clear, no icterus.   Conjunctiva pink. Ears:  Normal auditory acuity. Nose:  No deformity, discharge,  or lesions. Mouth:  No deformity or lesions, dentition normal. Neck:  Supple; no masses or thyromegaly. Lungs:  Clear throughout to auscultation.   No wheezes, crackles, or rhonchi. No acute distress. Heart:  Regular rate and rhythm; no murmurs, clicks, rubs,  or gallops. Abdomen:  Soft, nontender and nondistended. No masses, hepatosplenomegaly or hernias noted. Normal bowel sounds, without guarding, and without rebound.   Msk:  Symmetrical without gross deformities. Normal posture. Extremities:  Without clubbing or edema. Neurologic:  Alert and  oriented x4;  grossly normal neurologically. Skin:  Intact without significant lesions or rashes. Cervical Nodes:  No significant cervical adenopathy. Psych:  Alert and cooperative. Normal mood and affect.

## 2021-01-25 DIAGNOSIS — R7989 Other specified abnormal findings of blood chemistry: Secondary | ICD-10-CM | POA: Diagnosis not present

## 2021-01-26 LAB — BASIC METABOLIC PANEL
BUN/Creatinine Ratio: 15 (ref 9–20)
BUN: 18 mg/dL (ref 6–24)
CO2: 24 mmol/L (ref 20–29)
Calcium: 9.7 mg/dL (ref 8.7–10.2)
Chloride: 101 mmol/L (ref 96–106)
Creatinine, Ser: 1.24 mg/dL (ref 0.76–1.27)
Glucose: 76 mg/dL (ref 70–99)
Potassium: 4.5 mmol/L (ref 3.5–5.2)
Sodium: 141 mmol/L (ref 134–144)
eGFR: 73 mL/min/{1.73_m2} (ref >59)

## 2021-01-30 ENCOUNTER — Encounter: Payer: Self-pay | Admitting: Family Medicine

## 2022-01-20 ENCOUNTER — Encounter: Payer: Self-pay | Admitting: Nurse Practitioner

## 2022-01-20 ENCOUNTER — Ambulatory Visit: Payer: BC Managed Care – PPO | Admitting: Nurse Practitioner

## 2022-01-20 VITALS — BP 101/68 | HR 76 | Temp 97.3°F | Ht 67.0 in | Wt 270.0 lb

## 2022-01-20 DIAGNOSIS — J019 Acute sinusitis, unspecified: Secondary | ICD-10-CM

## 2022-01-20 DIAGNOSIS — B9689 Other specified bacterial agents as the cause of diseases classified elsewhere: Secondary | ICD-10-CM | POA: Diagnosis not present

## 2022-01-20 MED ORDER — AMOXICILLIN 875 MG PO TABS
875.0000 mg | ORAL_TABLET | Freq: Two times a day (BID) | ORAL | 0 refills | Status: AC
Start: 2022-01-20 — End: 2022-01-30

## 2022-01-20 NOTE — Progress Notes (Signed)
Subjective:    Patient ID: Jose Adkins, male    DOB: [birth year], 46 y.o.   MRN: [id number]  HPI 46 year old male patient presents to clinic today with head congestion, sinus pressure, runny nose, productive cough x7 days.  Patient denies any fevers, bodies, chills.  Patient does admit to occasional shortness of breath but denies any wheezing or difficulty breathing.     Review of Systems  HENT:  Positive for congestion and sinus pressure.   Respiratory:  Positive for cough.   All other systems reviewed and are negative.      Objective:   Physical Exam Vitals reviewed.  Constitutional:      General: He is not in acute distress.    Appearance: Normal appearance. He is normal weight. He is not ill-appearing, toxic-appearing or diaphoretic.  HENT:     Head: Normocephalic and atraumatic.     Right Ear: Tympanic membrane, ear canal and external ear normal.     Left Ear: Tympanic membrane, ear canal and external ear normal.     Nose: Congestion present. No rhinorrhea.     Right Sinus: Maxillary sinus tenderness and frontal sinus tenderness present.     Left Sinus: Maxillary sinus tenderness and frontal sinus tenderness present.     Mouth/Throat:     Mouth: Mucous membranes are moist.     Pharynx: Oropharynx is clear. No oropharyngeal exudate or posterior oropharyngeal erythema.  Eyes:     General: No scleral icterus.       Right eye: No discharge.        Left eye: No discharge.     Extraocular Movements: Extraocular movements intact.     Conjunctiva/sclera: Conjunctivae normal.     Pupils: Pupils are equal, round, and reactive to light.  Cardiovascular:     Rate and Rhythm: Normal rate and regular rhythm.     Pulses: Normal pulses.     Heart sounds: Normal heart sounds. No murmur heard. Pulmonary:     Effort: Pulmonary effort is normal. No respiratory distress.     Breath sounds: Normal breath sounds. No wheezing.  Musculoskeletal:     Cervical back: Normal range of  motion and neck supple. No rigidity or tenderness.     Comments: Grossly intact  Lymphadenopathy:     Cervical: No cervical adenopathy.  Skin:    General: Skin is warm.     Capillary Refill: Capillary refill takes less than 2 seconds.  Neurological:     Mental Status: He is alert.     Comments: Grossly intact  Psychiatric:        Mood and Affect: Mood normal.        Behavior: Behavior normal.           Assessment & Plan:   1. Acute bacterial rhinosinusitis -Suspect acute bacterial rhinosinusitis - amoxicillin (AMOXIL) 875 MG tablet; Take 1 tablet (875 mg total) by mouth 2 (two) times daily for 10 days.  Dispense: 20 tablet; Refill: 0 - COVID-19, Flu A+B and RSV -Return to clinic if symptoms do not improve within 2 to 3 days of antibiotic use -If COVID, flu, RSV testing returns positive stop antibiotics    Note:  This document was prepared using Dragon voice recognition software and may include unintentional dictation errors. Note - This record has been created using [employer].  Chart creation errors have been sought, but may not always  have been located. Such creation errors do not reflect on  the standard of medical  care.

## 2022-01-21 LAB — COVID-19, FLU A+B AND RSV
Influenza A, NAA: NOT DETECTED
Influenza B, NAA: NOT DETECTED
RSV, NAA: NOT DETECTED
SARS-CoV-2, NAA: NOT DETECTED

## 2022-01-24 ENCOUNTER — Encounter: Payer: Self-pay | Admitting: Family Medicine

## 2022-01-24 MED ORDER — AMOXICILLIN-POT CLAVULANATE 875-125 MG PO TABS: ORAL_TABLET | ORAL | 0 refills | Status: DC

## 2022-01-24 NOTE — Telephone Encounter (Signed)
Nurses I reviewed this message I reviewed the chart as well  May change medication to Augmentin 875 1 twice daily for 10 days  This is amoxicillin with clavulanate which makes it into a stronger medicine take with a snack  I certainly understand his position regarding not wanting to pay an extra co-pay or take more time off from work.  But at the same time if he should get worse it would be in his best interest to be rechecked because there is always a possibility that other conditions can come up including pneumonia or other issues.  The only way we would know these conditions are coming up is by checking him-unfortunately we do not control the nature of insurance companies with co-pays  If his condition is not improving or getting worse we are happy to help by seeing him here in the office for any further evaluation because that will be necessary should his condition stay as is or get worse-thank you-Dr. Nicki Reaper

## 2022-02-21 ENCOUNTER — Telehealth: Payer: BC Managed Care – PPO | Admitting: Physician Assistant

## 2022-02-21 DIAGNOSIS — B356 Tinea cruris: Secondary | ICD-10-CM | POA: Diagnosis not present

## 2022-02-21 MED ORDER — TERBINAFINE HCL 250 MG PO TABS: 250.0000 mg | ORAL_TABLET | Freq: Every day | ORAL | 0 refills | Status: AC

## 2022-02-21 MED ORDER — CLOTRIMAZOLE-BETAMETHASONE 1-0.05 % EX CREA: 1.0000 | TOPICAL_CREAM | Freq: Two times a day (BID) | CUTANEOUS | 0 refills | Status: DC

## 2022-02-21 NOTE — Progress Notes (Signed)
E-Visit for [employer]  We are sorry that you are not feeling well. Here is how we plan to help!  Based on what you shared with me it looks like you have tinea cruris, or "Jock Itch".  The symptoms of Jock Itch include red, peeling, itchy rash that affects the groin (crease where the leg meets the trunk).  This fungal infection can be spread through shared towels, clothing, bedding, or hard surfaces (particularly in moist areas) such as shower stalls, locker room floors, or pool area that has the fungus present. If you have a fungal infection on one part of your body, you can also spread it to other parts. For instance, men with a fungal infection on their feet sometimes spread it to their groin.  I am recommending:Clotrimazole 1% cream or gel, apply to area twice per day   Prescription medications are only indicated for an extensive rash or if over the counter treatments have failed.  I am prescribing:Terbinafine 250 mg once per day for one week  HOME CARE:  Keep affected area clean, dry, and cool. Wash with soap and shampoo after sports or exercise and dry yourself well after bathing or swimming Wear cotton underwear and change them if they become damp or sweaty. Avoid using swimming pools, public showers, or baths.  GET HELP RIGHT AWAY IF:  Symptoms that don't away after treatment. Severe itching that persists. If your rash spreads or swells. If your rash begins to have drainage or smell. You develop a fever.  MAKE SURE YOU   Understand these instructions. Will watch your condition. Will get help right away if you are not doing well or get worse.  Thank you for choosing an e-visit.  Your e-visit answers were reviewed by a board certified advanced clinical practitioner to complete your personal care plan. Depending upon the condition, your plan could have included both over the counter or prescription medications.  Please review your pharmacy choice. Make sure the pharmacy is  open so you can pick up prescription now. If there is a problem, you may contact your provider through [employer] and have the prescription routed to another pharmacy.  Your safety is important to [place]. If you have drug allergies check your prescription carefully.   For the next 24 hours you can use MyChart to ask questions about today's visit, request a non-urgent call back, or ask for a work or school excuse. You will get an email in the next two days asking about your experience. I hope that your e-visit has been valuable and will speed your recovery.   References or for more information:  SocialFulfillment[employer]hu https://hebert-johnson.com/.html BetaTrainer.de?search=jock%20itch&source=search_result&selectedTitle=3~52&usage_type=default&display_rank=3  I have spent 5 minutes in review of e-visit questionnaire, review and updating patient chart, medical decision making and response to patient.   Mar Daring, PA-C

## 2022-05-09 ENCOUNTER — Telehealth: Payer: BC Managed Care – PPO | Admitting: Family Medicine

## 2022-05-09 DIAGNOSIS — J019 Acute sinusitis, unspecified: Secondary | ICD-10-CM

## 2022-05-09 DIAGNOSIS — B9689 Other specified bacterial agents as the cause of diseases classified elsewhere: Secondary | ICD-10-CM | POA: Diagnosis not present

## 2022-05-09 MED ORDER — AMOXICILLIN-POT CLAVULANATE 875-125 MG PO TABS: 1.0000 | ORAL_TABLET | Freq: Two times a day (BID) | ORAL | 0 refills | Status: AC

## 2022-05-09 NOTE — Progress Notes (Signed)

## 2022-12-07 ENCOUNTER — Encounter: Payer: Self-pay | Admitting: Family Medicine

## 2022-12-07 ENCOUNTER — Other Ambulatory Visit: Payer: Self-pay

## 2022-12-07 ENCOUNTER — Ambulatory Visit (INDEPENDENT_AMBULATORY_CARE_PROVIDER_SITE_OTHER): Payer: Managed Care, Other (non HMO) | Admitting: Family Medicine

## 2022-12-07 VITALS — BP 126/86 | HR 54 | Temp 98.3°F | Ht 67.0 in | Wt 270.8 lb

## 2022-12-07 DIAGNOSIS — Z0000 Encounter for general adult medical examination without abnormal findings: Secondary | ICD-10-CM

## 2022-12-07 DIAGNOSIS — Z13220 Encounter for screening for lipoid disorders: Secondary | ICD-10-CM | POA: Diagnosis not present

## 2022-12-07 DIAGNOSIS — R7989 Other specified abnormal findings of blood chemistry: Secondary | ICD-10-CM

## 2022-12-07 DIAGNOSIS — E559 Vitamin D deficiency, unspecified: Secondary | ICD-10-CM | POA: Diagnosis not present

## 2022-12-07 DIAGNOSIS — Z131 Encounter for screening for diabetes mellitus: Secondary | ICD-10-CM

## 2022-12-07 DIAGNOSIS — Z0001 Encounter for general adult medical examination with abnormal findings: Secondary | ICD-10-CM

## 2022-12-07 DIAGNOSIS — D696 Thrombocytopenia, unspecified: Secondary | ICD-10-CM

## 2022-12-07 NOTE — Progress Notes (Signed)
Subjective:    Patient ID: Jose Adkins, male    DOB: [birth year], 47 y.o.   MRN: [id number]  HPI The patient comes in today for a wellness visit.    A review of their health history was completed.  A review of medications was also completed.  Any needed refills; no  Eating habits: good  Falls/  MVA accidents in past few months: no  Regular exercise: yes, cardio kick boxing, walking  Specialist pt sees on regular basis: no  Preventative health issues were discussed.   Additional concerns:  Discuss diet.     Review of Systems     Objective:   Physical Exam  General-in no acute distress Eyes-no discharge Lungs-respiratory rate normal, CTA CV-no murmurs,RRR Extremities skin warm dry no edema Neuro grossly normal Behavior normal, alert       Assessment & Plan:  1. Well adult exam Adult wellness-complete.wellness physical was conducted today. Importance of diet and exercise were discussed in detail.  Importance of stress reduction and healthy living were discussed.  In addition to this a discussion regarding safety was also covered.  We also reviewed over immunizations and gave recommendations regarding current immunization needed for age.   In addition to this additional areas were also touched on including: Preventative health exams needed:  Colonoscopy up-to-date  Patient was advised yearly wellness exam   2. Morbid obesity (HCC) Patient has done a fantastic job He is maintaining a relatively calorie restriction approximately 15 100-16 100 thank you  3. Vitamin D deficiency Check lab work  4. Screening, lipid Check labs  5. Screening for diabetes mellitus Check labs  Dietary patient is doing a restricted diet with fasting 16 hours a day drinking a fair amount of water so we will need to look at the sodium in addition to this does not get much vegetables we talked about trying to get some low glycemic vegetables into his diet and making sure  he takes a multivitamin daily he is getting plenty of protein

## 2022-12-07 NOTE — Addendum Note (Signed)
Addended by: Lilyan Punt A on: 12/07/2022 05:50 PM   Modules accepted: Orders

## 2022-12-17 ENCOUNTER — Encounter: Payer: Self-pay | Admitting: Family Medicine

## 2022-12-17 LAB — CBC WITH DIFFERENTIAL/PLATELET
Basophils Absolute: 0 10*3/uL (ref 0.0–0.2)
Basos: 1 %
EOS (ABSOLUTE): 0.1 10*3/uL (ref 0.0–0.4)
Eos: 2 %
Hematocrit: 50.2 % (ref 37.5–51.0)
Hemoglobin: 15.5 g/dL (ref 13.0–17.7)
Immature Grans (Abs): 0 10*3/uL (ref 0.0–0.1)
Immature Granulocytes: 0 %
Lymphocytes Absolute: 1.6 10*3/uL (ref 0.7–3.1)
Lymphs: 35 %
MCH: 27.2 pg (ref 26.6–33.0)
MCHC: 30.9 g/dL — ABNORMAL LOW (ref 31.5–35.7)
MCV: 88 fL (ref 79–97)
Monocytes Absolute: 0.4 10*3/uL (ref 0.1–0.9)
Monocytes: 8 %
Neutrophils Absolute: 2.5 10*3/uL (ref 1.4–7.0)
Neutrophils: 54 %
Platelets: 114 10*3/uL — ABNORMAL LOW (ref 150–450)
RBC: 5.7 x10E6/uL (ref 4.14–5.80)
RDW: 14.6 % (ref 11.6–15.4)
WBC: 4.6 10*3/uL (ref 3.4–10.8)

## 2022-12-17 LAB — CMP14+EGFR
ALT: 25 [IU]/L (ref 0–44)
AST: 29 [IU]/L (ref 0–40)
Albumin: 4.8 g/dL (ref 4.1–5.1)
Alkaline Phosphatase: 62 [IU]/L (ref 44–121)
BUN/Creatinine Ratio: 11 (ref 9–20)
BUN: 16 mg/dL (ref 6–24)
Bilirubin Total: 0.6 mg/dL (ref 0.0–1.2)
CO2: 24 mmol/L (ref 20–29)
Calcium: 9.4 mg/dL (ref 8.7–10.2)
Chloride: 101 mmol/L (ref 96–106)
Creatinine, Ser: 1.43 mg/dL — ABNORMAL HIGH (ref 0.76–1.27)
Globulin, Total: 2.3 g/dL (ref 1.5–4.5)
Glucose: 73 mg/dL (ref 70–99)
Potassium: 4.6 mmol/L (ref 3.5–5.2)
Sodium: 140 mmol/L (ref 134–144)
Total Protein: 7.1 g/dL (ref 6.0–8.5)
eGFR: 61 mL/min/{1.73_m2} (ref >59)

## 2022-12-17 LAB — LIPID PANEL
Chol/HDL Ratio: 3.3 {ratio} (ref 0.0–5.0)
Cholesterol, Total: 133 mg/dL (ref 100–199)
HDL: 40 mg/dL (ref >39)
LDL Chol Calc (NIH): 75 mg/dL (ref 0–99)
Triglycerides: 95 mg/dL (ref 0–149)
VLDL Cholesterol Cal: 18 mg/dL (ref 5–40)

## 2022-12-17 LAB — VITAMIN D 25 HYDROXY (VIT D DEFICIENCY, FRACTURES): Vit D, 25-Hydroxy: 25.1 ng/mL — ABNORMAL LOW (ref 30.0–100.0)

## 2022-12-18 ENCOUNTER — Encounter: Payer: Self-pay | Admitting: Family Medicine

## 2022-12-20 NOTE — Addendum Note (Signed)
Addended by: Margaretha Sheffield on: 12/20/2022 04:02 PM   Modules accepted: Orders

## 2023-01-31 ENCOUNTER — Encounter: Payer: Self-pay | Admitting: Family Medicine

## 2023-02-02 NOTE — Telephone Encounter (Signed)
Nurses This was already handled I filled out this form in September It was dated and sent to the front and my understanding was they were to fax it It is under media Please reprint this Please place it on my desk I will write the note with it then we will resend it again not sure what the problem was previously But it appears that we have already handled this in September and the best I can tell maybe they did not receive it through the fax  So please do the above I will put a note with this indicating that we did this in September and we are really doing it again in November then fax it to them Then rescan the information along with my note into the chart And also mail a copy to the patient And send the patient a note letting them know that this was handled in September but we are resending it  I will be in the office Friday morning we can handle it at that time please go ahead print note as requested above

## 2023-02-02 NOTE — Telephone Encounter (Signed)
Actually that is 2024 but the cross mark did not come through well I will write the date by that and sign it then we will resend it again  Please printed out and put it on my desk

## 2023-02-02 NOTE — Telephone Encounter (Signed)
Copied from CRM [id number]. Topic: Medical Record Request - Records Request >> Feb 02, 2023  9:58 AM Prudencio Pair wrote: Reason for CRM: Patient called stating that he had a physical done in September. He stated the paperwork from the physical was suppose to be faxed to his insurance company but [employer] stated they only have the last physical showing in 2021. Pt states he thinks that the clinic may have put the incorrect date and would like for them to correct that and re-fax the updated paperwork back to insurance company. He stated the fax information is on the paperwork. Pt's CB # [id number].

## 2023-02-20 ENCOUNTER — Ambulatory Visit (INDEPENDENT_AMBULATORY_CARE_PROVIDER_SITE_OTHER): Payer: Managed Care, Other (non HMO) | Admitting: Family Medicine

## 2023-02-20 VITALS — BP 128/83 | HR 78 | Temp 98.1°F | Ht 67.0 in | Wt 266.8 lb

## 2023-02-20 DIAGNOSIS — F411 Generalized anxiety disorder: Secondary | ICD-10-CM

## 2023-02-20 MED ORDER — FLUOXETINE HCL 20 MG PO TABS: 20.0000 mg | ORAL_TABLET | Freq: Every day | ORAL | 3 refills | Status: AC

## 2023-02-20 MED ORDER — CLONAZEPAM 0.5 MG PO TABS: ORAL_TABLET | ORAL | 1 refills | Status: AC

## 2023-02-20 NOTE — Progress Notes (Signed)
   Subjective:    Patient ID: Jose Adkins, male    DOB: Oct 21, 1975, 47 y.o.   MRN: [id number]  HPI Patient has been experiencing significant stress lately This has been associated with having his adult son movement after a divorce He is trying to help get him situated Recently got him a job They are trying to get him a home to move into But the patient finds himself overwhelmed with the situation at times Not suicidal Not depressed Just more anxious and having frequent worrying   Review of Systems     Objective:   Physical Exam 30 minutes spent with patient discussing the issues focus was on that physical exam not indicated on today's visit       Assessment & Plan:   Patient not depressed Not suicidal We did discuss counseling I think he would benefit from [facility] He does have generalized anxiety disorder and is feeling overwhelmed We will start Prozac He will give [place] feedback within the next 3 weeks how that is doing for him Follow-up within 6 weeks Should problems get worse or have any other issues before then follow-up sooner

## 2023-02-20 NOTE — Patient Instructions (Signed)
Please send me update in 2 to 3 weeks

## 2023-02-21 NOTE — Addendum Note (Signed)
Addended byTrinidad Curet on: 02/21/2023 08:05 AM   Modules accepted: Orders

## 2023-02-27 ENCOUNTER — Encounter: Payer: Self-pay | Admitting: Family Medicine

## 2023-04-13 ENCOUNTER — Ambulatory Visit (INDEPENDENT_AMBULATORY_CARE_PROVIDER_SITE_OTHER): Payer: Managed Care, Other (non HMO) | Admitting: Mental Health

## 2023-04-13 DIAGNOSIS — F4323 Adjustment disorder with mixed anxiety and depressed mood: Secondary | ICD-10-CM | POA: Diagnosis not present

## 2023-04-13 NOTE — Progress Notes (Signed)
[facility] Initial Adult Exam  Name: Jose Adkins Date: 04/13/2023 MRN: [id number] DOB: [birth year] PCP: Babs Sciara, MD  Time spent: 50 minutes   Reason for Visit /Presenting Problem: patient reports life changes that have opened up doors" from the past. His son of age 49 moved back home recently due to marital issues. Patient is feeling overwhelmed, his son brought home a dog which adds to stress due to limited room. He is worried about his son, son's wife thinks he is an alcoholic. Patient feels he has failed as a father in some ways now. 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  Patient stated he is learning to live with his adult son. Patient is rx'd fluoxetine. He often does not feel like he is "measuring up" in life. He admits he does feels he is "enough".  Encouraged patient to return to therapy in 2 to 3 weeks.    Mental Status Exam:    Appearance:    Casual     Behavior:   Appropriate  Motor:   WNL  Speech/Language:    Clear and Coherent  Affect:   Full range   Mood:   Euthymic  Thought process:   Logical, linear, goal directed  Thought content:     WNL  Sensory/Perceptual disturbances:     none  Orientation:   x4  Attention:   Good  Concentration:   Good  Memory:   Intact  Fund of knowledge:    Consistent with age and development  Insight:     Good  Judgment:    Good  Impulse Control:   Good     Reported Symptoms:  sadness, anxiety, guilt  Risk Assessment: Danger to Self:  No Self-injurious Behavior: No Danger to Others: No Duty to Warn:no Physical Aggression / Violence:No  Access to Firearms a concern: No  Gang Involvement:No  Patient / guardian was educated about steps to take if suicide or homicide risk level increases between visits: yes While future psychiatric events cannot be accurately predicted, the patient does not currently require acute inpatient psychiatric care and does not  currently meet [facility] involuntary commitment criteria.  Medical History/Surgical History: Past Medical History:  Diagnosis Date   Anxiety    Depression    Hypertension    Kidney stone    Kidney stones    Low testosterone    Reflux    Sleep apnea    CPAP    Past Surgical History:  Procedure Laterality Date   COLONOSCOPY WITH PROPOFOL N/A 01/18/2021   Procedure: COLONOSCOPY WITH PROPOFOL;  Surgeon: Lanelle Bal, DO;  Location: [facility];  Service: Endoscopy;  Laterality: N/A;  8:00am   CYSTOSCOPY WITH RETROGRADE PYELOGRAM, URETEROSCOPY AND STENT PLACEMENT Left 09/09/2016   Procedure: CYSTOSCOPY WITH RETROGRADE PYELOGRAM, URETEROSCOPY AND STENT PLACEMENT, FIRST STAGE;  Surgeon: Sebastian Ache, MD;  Location: [facility];  Service: Urology;  Laterality: Left;   CYSTOSCOPY WITH RETROGRADE PYELOGRAM, URETEROSCOPY AND STENT PLACEMENT Left 09/30/2016   Procedure: 2ND STAGE CYSTOSCOPY WITH RETROGRADE PYELOGRAM, URETEROSCOPY AND STENT EXCHANGE;  Surgeon: Sebastian Ache, MD;  Location: [facility];  Service: Urology;  Laterality: Left;   HOLMIUM LASER APPLICATION Left 09/09/2016   Procedure: HOLMIUM LASER APPLICATION;  Surgeon: Sebastian Ache, MD;  Location: [facility];  Service: Urology;  Laterality: Left;   HOLMIUM LASER APPLICATION Left 09/30/2016   Procedure: HOLMIUM LASER APPLICATION;  Surgeon: Sebastian Ache, MD;  Location: [facility];  Service: Urology;  Laterality: Left;   POLYPECTOMY  01/18/2021   Procedure: POLYPECTOMY;  Surgeon: Lanelle Bal, DO;  [facility];  Service: Endoscopy;;   VASECTOMY  2002    Medications: Current Outpatient Medications  Medication Sig Dispense Refill   clonazePAM (KLONOPIN) 0.5 MG tablet 1/2 to 1 bid prn 14 tablet 1   FLUoxetine (PROZAC) 20 MG tablet Take 1 tablet (20 mg total) by mouth daily. 90 tablet 3   Polyethylene Glycol 400 (BLINK TEARS) 0.25 % SOLN Place 1 drop into both eyes daily as needed (dry eyes).     No current  facility-administered medications for this visit.    No Known Allergies  Diagnoses:    ICD-10-CM   1. Adjustment disorder with mixed anxiety and depressed mood  F43.23       Plan of Care: TBD   Waldron Session, [facility]

## 2023-05-25 ENCOUNTER — Ambulatory Visit: Payer: BC Managed Care – PPO | Admitting: Mental Health

## 2023-06-06 ENCOUNTER — Ambulatory Visit: Payer: BC Managed Care – PPO | Admitting: Mental Health

## 2023-06-22 ENCOUNTER — Ambulatory Visit: Payer: BC Managed Care – PPO | Admitting: Mental Health

## 2023-12-08 ENCOUNTER — Ambulatory Visit (INDEPENDENT_AMBULATORY_CARE_PROVIDER_SITE_OTHER): Payer: Self-pay | Admitting: Physician Assistant

## 2023-12-08 ENCOUNTER — Encounter: Payer: Self-pay | Admitting: Physician Assistant

## 2023-12-08 VITALS — BP 127/87 | HR 73 | Temp 97.5°F | Ht 67.0 in | Wt 274.6 lb

## 2023-12-08 DIAGNOSIS — Z0000 Encounter for general adult medical examination without abnormal findings: Secondary | ICD-10-CM

## 2023-12-08 DIAGNOSIS — Z13220 Encounter for screening for lipoid disorders: Secondary | ICD-10-CM

## 2023-12-08 NOTE — Progress Notes (Signed)
 Complete physical exam  Patient: Jose Adkins   DOB: 1975-05-08   48 y.o. Male  MRN: [id number]  Subjective:    Chief Complaint  Patient presents with   Annual Exam    Jose Adkins is a 48 y.o. male who presents today for a complete physical exam. He reports consuming a carnivore diet, with intermittent fasting. He participate in cardio exercise 3-4 times a week and walks for exercise on the weekends. He generally feels well. He reports sleeping well. He does not have additional problems to discuss today.    Most recent fall risk assessment:    12/08/2023   11:29 AM  Fall Risk   Falls in the past year? 0  Number falls in past yr: 0  Injury with Fall? 0  Risk for fall due to : No Fall Risks  Follow up Falls evaluation completed     Most recent depression screenings:    12/08/2023   11:29 AM 02/20/2023    1:47 PM  PHQ 2/9 Scores  PHQ - 2 Score 1 1  PHQ- 9 Score 3 5    Vision:Within last year and Dental: No current dental problems and Receives regular dental care  Patient Care Team: Alphonsa Glendia LABOR, MD as PCP - General (Family Medicine) Cindie Carlin POUR, DO as Consulting Physician (Gastroenterology)   Outpatient Medications Prior to Visit  Medication Sig   clonazePAM  (KLONOPIN ) 0.5 MG tablet 1/2 to 1 bid prn (Patient taking differently: Take 0.5 mg by mouth as needed for anxiety. 1/2 to 1 bid prn)   cycloSPORINE (RESTASIS) 0.05 % ophthalmic emulsion Place 1 drop into both eyes 2 (two) times daily.   FLUoxetine  (PROZAC ) 20 MG tablet Take 1 tablet (20 mg total) by mouth daily.   [DISCONTINUED] Polyethylene Glycol 400 (BLINK TEARS) 0.25 % SOLN Place 1 drop into both eyes daily as needed (dry eyes).   No facility-administered medications prior to visit.    Review of Systems  Constitutional:  Negative for chills, fever and malaise/fatigue.  Eyes:  Negative for blurred vision and double vision.  Respiratory:  Negative for cough and shortness of breath.    Cardiovascular:  Negative for chest pain and palpitations.  Musculoskeletal:  Negative for joint pain and myalgias.  Neurological:  Negative for dizziness and headaches.  Psychiatric/Behavioral:  Negative for depression. The patient is not nervous/anxious.        Objective:     BP 127/87   Pulse 73   Temp (!) 97.5 F (36.4 C)   Ht 5' 7 (1.702 m)   Wt 274 lb 9.6 oz (124.6 kg)   SpO2 97%   BMI 43.01 kg/m   Physical Exam Constitutional:      General: He is not in acute distress.    Appearance: Normal appearance. He is obese. He is not ill-appearing.  HENT:     Head: Normocephalic and atraumatic.     Nose: Nose normal.     Mouth/Throat:     Mouth: Mucous membranes are moist.     Pharynx: Oropharynx is clear. No posterior oropharyngeal erythema.  Eyes:     Extraocular Movements: Extraocular movements intact.     Conjunctiva/sclera: Conjunctivae normal.  Neck:     Thyroid: No thyroid mass, thyromegaly or thyroid tenderness.  Cardiovascular:     Rate and Rhythm: Normal rate and regular rhythm.     Heart sounds: Normal heart sounds. No murmur heard. Pulmonary:     Effort: Pulmonary effort is normal.  Breath sounds: Normal breath sounds. No wheezing.  Abdominal:     General: Abdomen is flat. Bowel sounds are normal.     Palpations: Abdomen is soft.     Tenderness: There is no abdominal tenderness.  Musculoskeletal:     Cervical back: Normal range of motion and neck supple.  Lymphadenopathy:     Cervical: No cervical adenopathy.  Skin:    General: Skin is warm and dry.  Neurological:     General: No focal deficit present.     Mental Status: He is alert and oriented to person, place, and time.  Psychiatric:        Mood and Affect: Mood normal.        Behavior: Behavior normal.      No results found for any visits on 12/08/23.    Assessment & Plan:    Routine Health Maintenance and Physical Exam  Health Maintenance  Topic Date Due   HIV Screening  Never  done   Hepatitis C Screening  Never done   Hepatitis B Vaccine (1 of 3 - 19+ 3-dose series) Never done   DTaP/Tdap/Td vaccine (2 - Td or Tdap) 05/31/2022   COVID-19 Vaccine (3 - 2025-26 season) 12/24/2023*   Flu Shot  01/03/2024*   Colon Cancer Screening  01/19/2031   Pneumococcal Vaccine  Aged Out   HPV Vaccine  Aged Out   Meningitis B Vaccine  Aged Out  *Topic was postponed. The date shown is not the original due date.    Discussed health benefits of physical activity, and encouraged him to engage in regular exercise appropriate for his age and condition.  Problem List Items Addressed This Visit   None Visit Diagnoses       Annual visit for general adult medical examination without abnormal findings    -  Primary   Relevant Orders   CMP14+EGFR   CBC with Differential/Platelet     Screening for lipid disorders       Relevant Orders   Lipid panel      Return in about 1 year (around 12/07/2024).  Safety measures discussed. Immunizations reviewed: declines flu vaccine Diet and exercise/ lifestyle modifications discussed. Encouraged patient to keep up the good work on healthy lifestyle.  Recommend 150 minutes per week of exercise such as walking. Recommend lots of fresh produce to include fruits, vegetables, beans, healthy fats such as avocado, nuts, seeds, and 3-6 ounces of protein at each meal.  Avoid fried foods and fast food. Limit alcohol consumption: no more than one drink per day for women and 2 drinks per day for men.  Stress management discussed. Routine vision and dental screening discussed: recommend dentist every 6 months, gets vision checked every 1-2 years.  Health maintenance:  up to date, colonoscopy due in 2032 Questions answered.       Charmaine Homero Hyson, PA-C

## 2023-12-09 LAB — CBC WITH DIFFERENTIAL/PLATELET
Basophils Absolute: 0 x10E3/uL (ref 0.0–0.2)
Basos: 1 %
EOS (ABSOLUTE): 0.1 x10E3/uL (ref 0.0–0.4)
Eos: 1 %
Hematocrit: 47.8 % (ref 37.5–51.0)
Hemoglobin: 15.7 g/dL (ref 13.0–17.7)
Immature Grans (Abs): 0 x10E3/uL (ref 0.0–0.1)
Immature Granulocytes: 0 %
Lymphocytes Absolute: 1.4 x10E3/uL (ref 0.7–3.1)
Lymphs: 30 %
MCH: 28.3 pg (ref 26.6–33.0)
MCHC: 32.8 g/dL (ref 31.5–35.7)
MCV: 86 fL (ref 79–97)
Monocytes Absolute: 0.4 x10E3/uL (ref 0.1–0.9)
Monocytes: 9 %
Neutrophils Absolute: 2.7 x10E3/uL (ref 1.4–7.0)
Neutrophils: 59 %
Platelets: 121 x10E3/uL — ABNORMAL LOW (ref 150–450)
RBC: 5.54 x10E6/uL (ref 4.14–5.80)
RDW: 14.2 % (ref 11.6–15.4)
WBC: 4.7 x10E3/uL (ref 3.4–10.8)

## 2023-12-09 LAB — CMP14+EGFR
ALT: 16 IU/L (ref 0–44)
AST: 26 IU/L (ref 0–40)
Albumin: 4.7 g/dL (ref 4.1–5.1)
Alkaline Phosphatase: 70 IU/L (ref 47–123)
BUN/Creatinine Ratio: 13 (ref 9–20)
BUN: 17 mg/dL (ref 6–24)
Bilirubin Total: 0.6 mg/dL (ref 0.0–1.2)
CO2: 22 mmol/L (ref 20–29)
Calcium: 9.5 mg/dL (ref 8.7–10.2)
Chloride: 98 mmol/L (ref 96–106)
Creatinine, Ser: 1.33 mg/dL — ABNORMAL HIGH (ref 0.76–1.27)
Globulin, Total: 2.4 g/dL (ref 1.5–4.5)
Glucose: 69 mg/dL — ABNORMAL LOW (ref 70–99)
Potassium: 4.3 mmol/L (ref 3.5–5.2)
Sodium: 136 mmol/L (ref 134–144)
Total Protein: 7.1 g/dL (ref 6.0–8.5)
eGFR: 66 mL/min/1.73 (ref >59)

## 2023-12-09 LAB — LIPID PANEL
Chol/HDL Ratio: 3.8 ratio (ref 0.0–5.0)
Cholesterol, Total: 153 mg/dL (ref 100–199)
HDL: 40 mg/dL (ref >39)
LDL Chol Calc (NIH): 97 mg/dL (ref 0–99)
Triglycerides: 84 mg/dL (ref 0–149)
VLDL Cholesterol Cal: 16 mg/dL (ref 5–40)

## 2023-12-11 ENCOUNTER — Ambulatory Visit: Payer: Self-pay | Admitting: Physician Assistant
